# Patient Record
Sex: Female | Born: 1950
Health system: Southern US, Community
[De-identification: ages and names within clinical notes are randomized; demographics above are authoritative.]

## PROBLEM LIST (undated history)

## (undated) DIAGNOSIS — H269 Unspecified cataract: Secondary | ICD-10-CM

## (undated) DIAGNOSIS — Z923 Personal history of irradiation: Secondary | ICD-10-CM

## (undated) DIAGNOSIS — E785 Hyperlipidemia, unspecified: Secondary | ICD-10-CM

## (undated) DIAGNOSIS — E079 Disorder of thyroid, unspecified: Secondary | ICD-10-CM

## (undated) DIAGNOSIS — M810 Age-related osteoporosis without current pathological fracture: Secondary | ICD-10-CM

## (undated) DIAGNOSIS — M1990 Unspecified osteoarthritis, unspecified site: Secondary | ICD-10-CM

## (undated) DIAGNOSIS — M8580 Other specified disorders of bone density and structure, unspecified site: Secondary | ICD-10-CM

## (undated) DIAGNOSIS — T7840XA Allergy, unspecified, initial encounter: Secondary | ICD-10-CM

## (undated) DIAGNOSIS — M4850XA Collapsed vertebra, not elsewhere classified, site unspecified, initial encounter for fracture: Secondary | ICD-10-CM

## (undated) DIAGNOSIS — J449 Chronic obstructive pulmonary disease, unspecified: Secondary | ICD-10-CM

## (undated) HISTORY — DX: Unspecified cataract: H26.9

## (undated) HISTORY — DX: Collapsed vertebra, not elsewhere classified, site unspecified, initial encounter for fracture: M48.50XA

## (undated) HISTORY — DX: Disorder of thyroid, unspecified: E07.9

## (undated) HISTORY — PX: NOSE SURGERY: SHX723

## (undated) HISTORY — DX: Chronic obstructive pulmonary disease, unspecified: J44.9

## (undated) HISTORY — DX: Age-related osteoporosis without current pathological fracture: M81.0

## (undated) HISTORY — DX: Other specified disorders of bone density and structure, unspecified site: M85.80

## (undated) HISTORY — DX: Hyperlipidemia, unspecified: E78.5

## (undated) HISTORY — DX: Allergy, unspecified, initial encounter: T78.40XA

## (undated) HISTORY — PX: OTHER SURGICAL HISTORY: SHX169

---

## 1997-10-16 ENCOUNTER — Other Ambulatory Visit: Admission: RE | Admit: 1997-10-16 | Discharge: 1997-10-16 | Payer: Self-pay | Admitting: Obstetrics and Gynecology

## 1999-02-23 ENCOUNTER — Other Ambulatory Visit: Admission: RE | Admit: 1999-02-23 | Discharge: 1999-02-23 | Payer: Self-pay | Admitting: Obstetrics and Gynecology

## 1999-02-25 ENCOUNTER — Encounter: Payer: Self-pay | Admitting: Obstetrics and Gynecology

## 1999-02-25 ENCOUNTER — Ambulatory Visit (HOSPITAL_COMMUNITY): Admission: RE | Admit: 1999-02-25 | Discharge: 1999-02-25 | Payer: Self-pay | Admitting: Obstetrics and Gynecology

## 2001-04-02 ENCOUNTER — Other Ambulatory Visit: Admission: RE | Admit: 2001-04-02 | Discharge: 2001-04-02 | Payer: Self-pay | Admitting: Obstetrics and Gynecology

## 2001-04-25 ENCOUNTER — Encounter: Payer: Self-pay | Admitting: Obstetrics and Gynecology

## 2001-04-25 ENCOUNTER — Ambulatory Visit (HOSPITAL_COMMUNITY): Admission: RE | Admit: 2001-04-25 | Discharge: 2001-04-25 | Payer: Self-pay | Admitting: Obstetrics and Gynecology

## 2002-06-17 ENCOUNTER — Other Ambulatory Visit: Admission: RE | Admit: 2002-06-17 | Discharge: 2002-06-17 | Payer: Self-pay | Admitting: Obstetrics and Gynecology

## 2002-07-07 ENCOUNTER — Ambulatory Visit (HOSPITAL_COMMUNITY): Admission: RE | Admit: 2002-07-07 | Discharge: 2002-07-07 | Payer: Self-pay | Admitting: Obstetrics and Gynecology

## 2002-07-07 ENCOUNTER — Encounter: Payer: Self-pay | Admitting: Obstetrics and Gynecology

## 2003-09-02 ENCOUNTER — Other Ambulatory Visit: Admission: RE | Admit: 2003-09-02 | Discharge: 2003-09-02 | Payer: Self-pay | Admitting: Obstetrics and Gynecology

## 2003-09-18 ENCOUNTER — Ambulatory Visit (HOSPITAL_COMMUNITY): Admission: RE | Admit: 2003-09-18 | Discharge: 2003-09-18 | Payer: Self-pay | Admitting: Obstetrics and Gynecology

## 2004-05-20 ENCOUNTER — Encounter: Admission: RE | Admit: 2004-05-20 | Discharge: 2004-05-20 | Payer: Self-pay | Admitting: Family Medicine

## 2004-06-02 ENCOUNTER — Ambulatory Visit: Payer: Self-pay | Admitting: Family Medicine

## 2004-08-03 ENCOUNTER — Ambulatory Visit: Payer: Self-pay | Admitting: Family Medicine

## 2004-08-10 ENCOUNTER — Ambulatory Visit: Payer: Self-pay | Admitting: Family Medicine

## 2005-03-29 ENCOUNTER — Ambulatory Visit (HOSPITAL_COMMUNITY): Admission: RE | Admit: 2005-03-29 | Discharge: 2005-03-29 | Payer: Self-pay | Admitting: Family Medicine

## 2005-03-29 ENCOUNTER — Other Ambulatory Visit: Admission: RE | Admit: 2005-03-29 | Discharge: 2005-03-29 | Payer: Self-pay | Admitting: Obstetrics and Gynecology

## 2005-05-27 ENCOUNTER — Ambulatory Visit: Payer: Self-pay | Admitting: Family Medicine

## 2005-08-16 ENCOUNTER — Ambulatory Visit: Payer: Self-pay | Admitting: Family Medicine

## 2005-08-23 ENCOUNTER — Ambulatory Visit: Payer: Self-pay | Admitting: Family Medicine

## 2005-12-19 ENCOUNTER — Ambulatory Visit (HOSPITAL_COMMUNITY): Admission: RE | Admit: 2005-12-19 | Discharge: 2005-12-19 | Payer: Self-pay | Admitting: Family Medicine

## 2006-04-02 ENCOUNTER — Ambulatory Visit (HOSPITAL_COMMUNITY): Admission: RE | Admit: 2006-04-02 | Discharge: 2006-04-02 | Payer: Self-pay | Admitting: Family Medicine

## 2006-11-13 ENCOUNTER — Ambulatory Visit: Payer: Self-pay | Admitting: Family Medicine

## 2006-11-13 LAB — CONVERTED CEMR LAB
ALT: 11 units/L (ref 0–40)
AST: 15 units/L (ref 0–37)
Albumin: 3.4 g/dL — ABNORMAL LOW (ref 3.5–5.2)
Alkaline Phosphatase: 84 units/L (ref 39–117)
BUN: 13 mg/dL (ref 6–23)
Basophils Absolute: 0 10*3/uL (ref 0.0–0.1)
Basophils Relative: 0.3 % (ref 0.0–1.0)
Bilirubin, Direct: 0.1 mg/dL (ref 0.0–0.3)
CO2: 27 meq/L (ref 19–32)
Calcium: 9.3 mg/dL (ref 8.4–10.5)
Chloride: 105 meq/L (ref 96–112)
Cholesterol: 221 mg/dL (ref 0–200)
Creatinine, Ser: 0.9 mg/dL (ref 0.4–1.2)
Direct LDL: 123.8 mg/dL
Eosinophils Absolute: 0.1 10*3/uL (ref 0.0–0.6)
Eosinophils Relative: 0.8 % (ref 0.0–5.0)
GFR calc Af Amer: 83 mL/min
GFR calc non Af Amer: 69 mL/min
Glucose, Bld: 81 mg/dL (ref 70–99)
HCT: 43.4 % (ref 36.0–46.0)
HDL: 70.4 mg/dL (ref >39.0)
Hemoglobin: 14.8 g/dL (ref 12.0–15.0)
Lymphocytes Relative: 17.5 % (ref 12.0–46.0)
MCHC: 34.2 g/dL (ref 30.0–36.0)
MCV: 90.8 fL (ref 78.0–100.0)
Monocytes Absolute: 0.5 10*3/uL (ref 0.2–0.7)
Monocytes Relative: 5.7 % (ref 3.0–11.0)
Neutro Abs: 7.3 10*3/uL (ref 1.4–7.7)
Neutrophils Relative %: 75.7 % (ref 43.0–77.0)
Platelets: 380 10*3/uL (ref 150–400)
Potassium: 4.7 meq/L (ref 3.5–5.1)
RBC: 4.77 M/uL (ref 3.87–5.11)
RDW: 11.1 % — ABNORMAL LOW (ref 11.5–14.6)
Sodium: 140 meq/L (ref 135–145)
TSH: 2.33 microintl units/mL (ref 0.35–5.50)
Total Bilirubin: 0.7 mg/dL (ref 0.3–1.2)
Total CHOL/HDL Ratio: 3.1
Total Protein: 6.6 g/dL (ref 6.0–8.3)
Triglycerides: 148 mg/dL (ref 0–149)
VLDL: 30 mg/dL (ref 0–40)
WBC: 9.6 10*3/uL (ref 4.5–10.5)

## 2006-11-20 ENCOUNTER — Ambulatory Visit: Payer: Self-pay | Admitting: Family Medicine

## 2007-02-05 ENCOUNTER — Ambulatory Visit (HOSPITAL_COMMUNITY): Admission: RE | Admit: 2007-02-05 | Discharge: 2007-02-05 | Payer: Self-pay | Admitting: Family Medicine

## 2007-02-05 ENCOUNTER — Encounter: Payer: Self-pay | Admitting: Family Medicine

## 2007-02-21 ENCOUNTER — Encounter: Payer: Self-pay | Admitting: Family Medicine

## 2007-05-03 ENCOUNTER — Ambulatory Visit: Payer: Self-pay | Admitting: Family Medicine

## 2007-05-20 ENCOUNTER — Other Ambulatory Visit: Admission: RE | Admit: 2007-05-20 | Discharge: 2007-05-20 | Payer: Self-pay | Admitting: Obstetrics and Gynecology

## 2007-05-20 ENCOUNTER — Ambulatory Visit (HOSPITAL_COMMUNITY): Admission: RE | Admit: 2007-05-20 | Discharge: 2007-05-20 | Payer: Self-pay | Admitting: Family Medicine

## 2007-08-28 ENCOUNTER — Ambulatory Visit: Payer: Self-pay | Admitting: Family Medicine

## 2007-08-28 DIAGNOSIS — E785 Hyperlipidemia, unspecified: Secondary | ICD-10-CM | POA: Insufficient documentation

## 2007-08-28 DIAGNOSIS — E039 Hypothyroidism, unspecified: Secondary | ICD-10-CM | POA: Insufficient documentation

## 2007-08-29 ENCOUNTER — Ambulatory Visit: Payer: Self-pay | Admitting: Family Medicine

## 2007-09-18 ENCOUNTER — Telehealth: Payer: Self-pay | Admitting: Family Medicine

## 2007-11-21 ENCOUNTER — Ambulatory Visit: Payer: Self-pay | Admitting: Family Medicine

## 2007-11-21 LAB — CONVERTED CEMR LAB
Bilirubin Urine: NEGATIVE
Glucose, Urine, Semiquant: NEGATIVE
Nitrite: NEGATIVE
Protein, U semiquant: NEGATIVE
Specific Gravity, Urine: 1.025
Urobilinogen, UA: 0.2
WBC Urine, dipstick: NEGATIVE
pH: 5.5

## 2007-11-28 ENCOUNTER — Ambulatory Visit: Payer: Self-pay | Admitting: Family Medicine

## 2007-11-28 DIAGNOSIS — R3129 Other microscopic hematuria: Secondary | ICD-10-CM | POA: Insufficient documentation

## 2007-11-28 DIAGNOSIS — M129 Arthropathy, unspecified: Secondary | ICD-10-CM | POA: Insufficient documentation

## 2007-11-28 LAB — CONVERTED CEMR LAB
ALT: 15 units/L (ref 0–35)
AST: 19 units/L (ref 0–37)
Albumin: 3.6 g/dL (ref 3.5–5.2)
Alkaline Phosphatase: 86 units/L (ref 39–117)
BUN: 15 mg/dL (ref 6–23)
Basophils Absolute: 0 10*3/uL (ref 0.0–0.1)
Basophils Relative: 0.4 % (ref 0.0–1.0)
Bilirubin, Direct: 0.1 mg/dL (ref 0.0–0.3)
CO2: 29 meq/L (ref 19–32)
Calcium: 9.1 mg/dL (ref 8.4–10.5)
Chloride: 104 meq/L (ref 96–112)
Cholesterol: 227 mg/dL (ref 0–200)
Creatinine, Ser: 1 mg/dL (ref 0.4–1.2)
Direct LDL: 145.3 mg/dL
Eosinophils Absolute: 0.1 10*3/uL (ref 0.0–0.7)
Eosinophils Relative: 0.7 % (ref 0.0–5.0)
GFR calc Af Amer: 73 mL/min
GFR calc non Af Amer: 61 mL/min
Glucose, Bld: 76 mg/dL (ref 70–99)
HCT: 44.2 % (ref 36.0–46.0)
HDL: 74.8 mg/dL (ref >39.0)
Hemoglobin: 14.9 g/dL (ref 12.0–15.0)
Lymphocytes Relative: 17.7 % (ref 12.0–46.0)
MCHC: 33.6 g/dL (ref 30.0–36.0)
MCV: 93.1 fL (ref 78.0–100.0)
Monocytes Absolute: 0.5 10*3/uL (ref 0.1–1.0)
Monocytes Relative: 5.5 % (ref 3.0–12.0)
Neutro Abs: 7.1 10*3/uL (ref 1.4–7.7)
Neutrophils Relative %: 75.7 % (ref 43.0–77.0)
Platelets: 399 10*3/uL (ref 150–400)
Potassium: 4.1 meq/L (ref 3.5–5.1)
RBC: 4.75 M/uL (ref 3.87–5.11)
RDW: 11.7 % (ref 11.5–14.6)
Sodium: 138 meq/L (ref 135–145)
TSH: 2.18 microintl units/mL (ref 0.35–5.50)
Total Bilirubin: 0.3 mg/dL (ref 0.3–1.2)
Total CHOL/HDL Ratio: 3
Total Protein: 6.8 g/dL (ref 6.0–8.3)
Triglycerides: 86 mg/dL (ref 0–149)
VLDL: 17 mg/dL (ref 0–40)
WBC: 9.4 10*3/uL (ref 4.5–10.5)

## 2007-12-06 ENCOUNTER — Encounter: Admission: RE | Admit: 2007-12-06 | Discharge: 2007-12-06 | Payer: Self-pay | Admitting: Family Medicine

## 2007-12-11 ENCOUNTER — Ambulatory Visit: Payer: Self-pay | Admitting: Family Medicine

## 2007-12-11 LAB — CONVERTED CEMR LAB
OCCULT 1: NEGATIVE
OCCULT 2: NEGATIVE
OCCULT 3: NEGATIVE

## 2007-12-12 ENCOUNTER — Encounter: Payer: Self-pay | Admitting: Family Medicine

## 2007-12-20 ENCOUNTER — Encounter: Admission: RE | Admit: 2007-12-20 | Discharge: 2007-12-20 | Payer: Self-pay | Admitting: Family Medicine

## 2007-12-26 ENCOUNTER — Telehealth: Payer: Self-pay | Admitting: Family Medicine

## 2008-05-05 ENCOUNTER — Ambulatory Visit: Payer: Self-pay | Admitting: Family Medicine

## 2008-05-21 ENCOUNTER — Ambulatory Visit (HOSPITAL_COMMUNITY): Admission: RE | Admit: 2008-05-21 | Discharge: 2008-05-21 | Payer: Self-pay | Admitting: Addiction Medicine

## 2008-05-21 ENCOUNTER — Ambulatory Visit: Payer: Self-pay | Admitting: Obstetrics and Gynecology

## 2008-05-21 ENCOUNTER — Encounter: Payer: Self-pay | Admitting: Obstetrics and Gynecology

## 2008-05-21 ENCOUNTER — Other Ambulatory Visit: Admission: RE | Admit: 2008-05-21 | Discharge: 2008-05-21 | Payer: Self-pay | Admitting: Obstetrics and Gynecology

## 2008-11-27 ENCOUNTER — Ambulatory Visit: Payer: Self-pay | Admitting: Family Medicine

## 2008-11-27 LAB — CONVERTED CEMR LAB
Glucose, Urine, Semiquant: NEGATIVE
Nitrite: NEGATIVE
Specific Gravity, Urine: 1.02
Urobilinogen, UA: 0.2
pH: 6

## 2008-12-02 ENCOUNTER — Ambulatory Visit: Payer: Self-pay | Admitting: Family Medicine

## 2008-12-02 DIAGNOSIS — M109 Gout, unspecified: Secondary | ICD-10-CM | POA: Insufficient documentation

## 2008-12-02 LAB — CONVERTED CEMR LAB
ALT: 15 units/L (ref 0–35)
AST: 20 units/L (ref 0–37)
Albumin: 3.6 g/dL (ref 3.5–5.2)
Alkaline Phosphatase: 86 units/L (ref 39–117)
BUN: 16 mg/dL (ref 6–23)
Basophils Absolute: 0.1 10*3/uL (ref 0.0–0.1)
Basophils Relative: 0.8 % (ref 0.0–3.0)
Bilirubin, Direct: 0.1 mg/dL (ref 0.0–0.3)
CO2: 27 meq/L (ref 19–32)
Calcium: 9 mg/dL (ref 8.4–10.5)
Chloride: 108 meq/L (ref 96–112)
Cholesterol: 224 mg/dL — ABNORMAL HIGH (ref 0–200)
Creatinine, Ser: 0.9 mg/dL (ref 0.4–1.2)
Direct LDL: 141.9 mg/dL
Eosinophils Absolute: 0.1 10*3/uL (ref 0.0–0.7)
Eosinophils Relative: 1.1 % (ref 0.0–5.0)
GFR calc non Af Amer: 67.91 mL/min (ref >60)
Glucose, Bld: 85 mg/dL (ref 70–99)
HCT: 40.3 % (ref 36.0–46.0)
HDL: 62 mg/dL (ref >39.00)
Hemoglobin: 14 g/dL (ref 12.0–15.0)
Lymphocytes Relative: 21.8 % (ref 12.0–46.0)
Lymphs Abs: 1.9 10*3/uL (ref 0.7–4.0)
MCHC: 34.8 g/dL (ref 30.0–36.0)
MCV: 92.3 fL (ref 78.0–100.0)
Monocytes Absolute: 0.6 10*3/uL (ref 0.1–1.0)
Monocytes Relative: 6.5 % (ref 3.0–12.0)
Neutro Abs: 5.8 10*3/uL (ref 1.4–7.7)
Neutrophils Relative %: 69.8 % (ref 43.0–77.0)
Platelets: 350 10*3/uL (ref 150.0–400.0)
Potassium: 3.8 meq/L (ref 3.5–5.1)
RBC: 4.36 M/uL (ref 3.87–5.11)
RDW: 12 % (ref 11.5–14.6)
Sodium: 140 meq/L (ref 135–145)
TSH: 2.88 microintl units/mL (ref 0.35–5.50)
Total Bilirubin: 0.5 mg/dL (ref 0.3–1.2)
Total CHOL/HDL Ratio: 4
Total Protein: 7 g/dL (ref 6.0–8.3)
Triglycerides: 159 mg/dL — ABNORMAL HIGH (ref 0.0–149.0)
VLDL: 31.8 mg/dL (ref 0.0–40.0)
WBC: 8.5 10*3/uL (ref 4.5–10.5)

## 2009-01-28 ENCOUNTER — Telehealth: Payer: Self-pay | Admitting: Family Medicine

## 2009-04-28 ENCOUNTER — Ambulatory Visit: Payer: Self-pay | Admitting: Family Medicine

## 2009-06-11 ENCOUNTER — Ambulatory Visit (HOSPITAL_COMMUNITY): Admission: RE | Admit: 2009-06-11 | Discharge: 2009-06-11 | Payer: Self-pay | Admitting: Obstetrics and Gynecology

## 2009-06-11 ENCOUNTER — Ambulatory Visit: Payer: Self-pay | Admitting: Obstetrics and Gynecology

## 2009-06-11 ENCOUNTER — Other Ambulatory Visit: Admission: RE | Admit: 2009-06-11 | Discharge: 2009-06-11 | Payer: Self-pay | Admitting: Obstetrics and Gynecology

## 2009-06-11 LAB — HM MAMMOGRAPHY

## 2009-06-23 ENCOUNTER — Encounter: Admission: RE | Admit: 2009-06-23 | Discharge: 2009-06-23 | Payer: Self-pay | Admitting: Obstetrics and Gynecology

## 2009-06-29 ENCOUNTER — Ambulatory Visit: Payer: Self-pay | Admitting: Obstetrics and Gynecology

## 2009-11-26 ENCOUNTER — Telehealth: Payer: Self-pay | Admitting: Family Medicine

## 2009-11-26 ENCOUNTER — Ambulatory Visit: Payer: Self-pay | Admitting: Family Medicine

## 2009-12-07 ENCOUNTER — Ambulatory Visit: Payer: Self-pay | Admitting: Family Medicine

## 2009-12-07 DIAGNOSIS — J209 Acute bronchitis, unspecified: Secondary | ICD-10-CM | POA: Insufficient documentation

## 2009-12-07 LAB — CONVERTED CEMR LAB
Bilirubin Urine: NEGATIVE
Glucose, Urine, Semiquant: NEGATIVE
Nitrite: NEGATIVE
Specific Gravity, Urine: 1.01
Urobilinogen, UA: 0.2
pH: 6

## 2009-12-09 LAB — CONVERTED CEMR LAB
ALT: 13 units/L (ref 0–35)
AST: 18 units/L (ref 0–37)
Albumin: 3.8 g/dL (ref 3.5–5.2)
Alkaline Phosphatase: 92 units/L (ref 39–117)
BUN: 15 mg/dL (ref 6–23)
Basophils Absolute: 0.1 10*3/uL (ref 0.0–0.1)
Basophils Relative: 0.6 % (ref 0.0–3.0)
Bilirubin, Direct: 0.1 mg/dL (ref 0.0–0.3)
CO2: 29 meq/L (ref 19–32)
Calcium: 9.8 mg/dL (ref 8.4–10.5)
Chloride: 99 meq/L (ref 96–112)
Cholesterol: 252 mg/dL — ABNORMAL HIGH (ref 0–200)
Creatinine, Ser: 1 mg/dL (ref 0.4–1.2)
Direct LDL: 164.2 mg/dL
Eosinophils Absolute: 0.1 10*3/uL (ref 0.0–0.7)
Eosinophils Relative: 1.2 % (ref 0.0–5.0)
GFR calc non Af Amer: 58.58 mL/min (ref >60)
Glucose, Bld: 82 mg/dL (ref 70–99)
HCT: 42.5 % (ref 36.0–46.0)
HDL: 80.8 mg/dL (ref >39.00)
Hemoglobin: 14.7 g/dL (ref 12.0–15.0)
Lymphocytes Relative: 25.3 % (ref 12.0–46.0)
Lymphs Abs: 2.3 10*3/uL (ref 0.7–4.0)
MCHC: 34.6 g/dL (ref 30.0–36.0)
MCV: 92.8 fL (ref 78.0–100.0)
Monocytes Absolute: 0.7 10*3/uL (ref 0.1–1.0)
Monocytes Relative: 8 % (ref 3.0–12.0)
Neutro Abs: 5.8 10*3/uL (ref 1.4–7.7)
Neutrophils Relative %: 64.9 % (ref 43.0–77.0)
Platelets: 364 10*3/uL (ref 150.0–400.0)
Potassium: 5.6 meq/L — ABNORMAL HIGH (ref 3.5–5.1)
RBC: 4.58 M/uL (ref 3.87–5.11)
RDW: 12.6 % (ref 11.5–14.6)
Sodium: 138 meq/L (ref 135–145)
TSH: 3.75 microintl units/mL (ref 0.35–5.50)
Total Bilirubin: 0.2 mg/dL — ABNORMAL LOW (ref 0.3–1.2)
Total CHOL/HDL Ratio: 3
Total Protein: 7.2 g/dL (ref 6.0–8.3)
Triglycerides: 122 mg/dL (ref 0.0–149.0)
VLDL: 24.4 mg/dL (ref 0.0–40.0)
WBC: 8.9 10*3/uL (ref 4.5–10.5)

## 2009-12-17 ENCOUNTER — Ambulatory Visit: Payer: Self-pay | Admitting: Family Medicine

## 2010-04-04 ENCOUNTER — Ambulatory Visit (HOSPITAL_COMMUNITY): Admission: RE | Admit: 2010-04-04 | Discharge: 2010-04-04 | Payer: Self-pay | Admitting: Family Medicine

## 2010-04-04 ENCOUNTER — Encounter: Payer: Self-pay | Admitting: Family Medicine

## 2010-04-20 ENCOUNTER — Ambulatory Visit: Payer: Self-pay | Admitting: Family Medicine

## 2010-06-14 ENCOUNTER — Ambulatory Visit (HOSPITAL_COMMUNITY)
Admission: RE | Admit: 2010-06-14 | Discharge: 2010-06-14 | Payer: Self-pay | Source: Home / Self Care | Admitting: Obstetrics and Gynecology

## 2010-06-15 ENCOUNTER — Other Ambulatory Visit: Admission: RE | Admit: 2010-06-15 | Discharge: 2010-06-15 | Payer: Self-pay | Admitting: Obstetrics and Gynecology

## 2010-06-15 ENCOUNTER — Ambulatory Visit: Payer: Self-pay | Admitting: Obstetrics and Gynecology

## 2010-08-01 ENCOUNTER — Ambulatory Visit: Admit: 2010-08-01 | Payer: Self-pay | Admitting: Obstetrics and Gynecology

## 2010-08-03 ENCOUNTER — Telehealth: Payer: Self-pay | Admitting: Family Medicine

## 2010-08-08 ENCOUNTER — Other Ambulatory Visit: Payer: Self-pay | Admitting: Family Medicine

## 2010-08-08 ENCOUNTER — Ambulatory Visit
Admission: RE | Admit: 2010-08-08 | Discharge: 2010-08-08 | Payer: Self-pay | Source: Home / Self Care | Attending: Family Medicine | Admitting: Family Medicine

## 2010-08-08 LAB — HEPATIC FUNCTION PANEL
ALT: 13 U/L (ref 0–35)
AST: 17 U/L (ref 0–37)
Albumin: 3.9 g/dL (ref 3.5–5.2)
Alkaline Phosphatase: 88 U/L (ref 39–117)
Bilirubin, Direct: 0.1 mg/dL (ref 0.0–0.3)
Total Bilirubin: 0.5 mg/dL (ref 0.3–1.2)
Total Protein: 7 g/dL (ref 6.0–8.3)

## 2010-08-08 LAB — LIPID PANEL
Cholesterol: 180 mg/dL (ref 0–200)
HDL: 65.9 mg/dL (ref >39.00)
LDL Cholesterol: 87 mg/dL (ref 0–99)
Total CHOL/HDL Ratio: 3
Triglycerides: 138 mg/dL (ref 0.0–149.0)
VLDL: 27.6 mg/dL (ref 0.0–40.0)

## 2010-08-23 NOTE — Assessment & Plan Note (Signed)
Summary: FLU SHOT // RS  Nurse Visit  CC: Flu shot./kb   Allergies: 1)  ! * Red Dye  Orders Added: 1)  Admin 1st Vaccine [90471] 2)  Flu Vaccine 51yrs + [16109]               Flu Vaccine Consent Questions     Do you have a history of severe allergic reactions to this vaccine? no    Any prior history of allergic reactions to egg and/or gelatin? no    Do you have a sensitivity to the preservative Thimersol? no    Do you have a past history of Guillan-Barre Syndrome? no    Do you currently have an acute febrile illness? no    Have you ever had a severe reaction to latex? no    Vaccine information given and explained to patient? yes    Are you currently pregnant? no    Lot Number:[id number]   Exp Date:01/21/2011   Site Given  Left Deltoid IMu

## 2010-08-23 NOTE — Assessment & Plan Note (Signed)
Summary: cpx/no pap/njr   Vital Signs:  Patient Profile:   60 Years Old Female Weight:      146 pounds Temp:     98.3 degrees F Pulse rate:   76 / minute BP sitting:   140 / 80  (left arm) Cuff size:   regular  Vitals Entered By: Pura Spice, RN (Nov 28, 2007 2:53 PM)                 Chief Complaint:  for cpx and pap by DR. Gottesegen.  History of Present Illness: Pt in for physical examination,yearly evaluation had pap smear and pelvic ultrasoun revals fibroids, he reduced premarin to .625 complains of pain left mp joint thumb , responding to advil but needs to increase dosage submandibular node has been normal since last txcolonoscopic exam was neg 9 yrs ago, due 2010 Had 2plus hematuria at Dr. Nicolasa Ducking office as well as here last year and also this year       Current Allergies (reviewed today): ! * RED DYE     Review of Systems      See HPI  General      Denies chills, fatigue, fever, loss of appetite, malaise, sleep disorder, sweats, weakness, and weight loss.  Eyes      Denies blurring, discharge, double vision, eye irritation, eye pain, halos, itching, light sensitivity, red eye, vision loss-1 eye, and vision loss-both eyes.  ENT      Complains of nasal congestion.      rsponds to Nasacort AQ  CV      Denies bluish discoloration of lips or nails, chest pain or discomfort, difficulty breathing at night, difficulty breathing while lying down, fainting, fatigue, leg cramps with exertion, lightheadness, near fainting, palpitations, shortness of breath with exertion, swelling of feet, swelling of hands, and weight gain.  Resp      Denies chest discomfort, chest pain with inspiration, cough, coughing up blood, excessive snoring, hypersomnolence, morning headaches, pleuritic, shortness of breath, sputum productive, and wheezing.  GI      Denies abdominal pain, bloody stools, change in bowel habits, constipation, dark tarry stools, diarrhea, excessive  appetite, gas, hemorrhoids, indigestion, loss of appetite, nausea, vomiting, vomiting blood, and yellowish skin color.  GU      See HPI      Complains of hematuria.      Denies abnormal vaginal bleeding, decreased libido, discharge, dysuria, incontinence, nocturia, urinary frequency, and urinary hesitancy.  MS      See HPI      Complains of stiffness.      back stiffness  Derm      Denies changes in color of skin, changes in nail beds, dryness, excessive perspiration, flushing, hair loss, insect bite(s), itching, lesion(s), poor wound healing, and rash.  Neuro      Denies brief paralysis, difficulty with concentration, disturbances in coordination, falling down, headaches, inability to speak, memory loss, numbness, poor balance, seizures, sensation of room spinning, tingling, tremors, visual disturbances, and weakness.  Psych      Denies alternate hallucination ( auditory/visual), anxiety, depression, easily angered, easily tearful, irritability, mental problems, panic attacks, sense of great danger, suicidal thoughts/plans, thoughts of violence, unusual visions or sounds, and thoughts /plans of harming others.  Endo      Denies cold intolerance, excessive hunger, excessive thirst, excessive urination, heat intolerance, polyuria, and weight change.  Heme      Denies abnormal bruising, bleeding, enlarge lymph nodes, fevers, pallor, and skin discoloration.  Allergy      Denies hives or rash, itching eyes, persistent infections, seasonal allergies, and sneezing.  ENT      Complains of nasal congestion.   Physical Exam  General:     Well-developed,well-nourished,in no acute distress; alert,appropriate and cooperative throughout examination Head:     Normocephalic and atraumatic without obvious abnormalities. No apparent alopecia or balding. Eyes:     No corneal or conjunctival inflammation noted. EOMI. Perrla. Funduscopic exam benign, without hemorrhages, exudates or papilledema.  Vision grossly normal. Ears:     External ear exam shows no significant lesions or deformities.  Otoscopic examination reveals clear canals, tympanic membranes are intact bilaterally without bulging, retraction, inflammation or discharge. Hearing is grossly normal bilaterally. Nose:     minimal nasal congestion Mouth:     Oral mucosa and oropharynx without lesions or exudates.  Teeth in good repair. Neck:     No deformities, masses, or tenderness noted. Chest Wall:     No deformities, masses, or tenderness noted. Breasts:     No mass, nodules, thickening, tenderness, bulging, retraction, inflamation, nipple discharge or skin changes noted.   Lungs:     Normal respiratory effort, chest expands symmetrically. Lungs are clear to auscultation, no crackles or wheezes. Heart:     Normal rate and regular rhythm. S1 and S2 normal without gallop, murmur, click, rub or other extra sounds. Abdomen:     Bowel sounds positive,abdomen soft and non-tender without masses, organomegaly or hernias noted. Rectal:     gyn Genitalia:     gyn Msk:     tender left mp joint left thumb Pulses:     R and L carotid,radial,femoral,dorsalis pedis and posterior tibial pulses are full and equal bilaterally Extremities:     No clubbing, cyanosis, edema, or deformity noted with normal full range of motion of all joints.   Neurologic:     No cranial nerve deficits noted. Station and gait are normal. Plantar reflexes are down-going bilaterally. DTRs are symmetrical throughout. Sensory, motor and coordinative functions appear intact. Skin:     Intact without suspicious lesions or rashes Cervical Nodes:     No lymphadenopathy noted Axillary Nodes:     No palpable lymphadenopathy Inguinal Nodes:     No significant adenopathy Psych:     Cognition and judgment appear intact. Alert and cooperative with normal attention span and concentration. No apparent delusions, illusions, hallucinations    Impression &  Recommendations:  Problem # 1:  PHYSICAL EXAMINATION (ICD-V70.0) Assessment: New  Problem # 2:  HYPOTHYROIDISM (ICD-244.9) Assessment: Unchanged  The following medications were removed from the medication list:    Synthroid 50 Mcg Tabs (Levothyroxine sodium)  Her updated medication list for this problem includes:    Synthroid 100 Mcg Tabs (Levothyroxine sodium) .Marland Kitchen... 1 by mouth once daily   Problem # 3:  ARTHRITIS (ICD-716.90) Assessment: New  Problem # 4:  MENOPAUSAL SYNDROME (ICD-627.2) Assessment: Unchanged  Her updated medication list for this problem includes:    Premarin 0.625 Mg Tabs (Estrogens conjugated) ..... Once daily   Problem # 5:  HYPERLIPIDEMIA (ICD-272.4) Assessment: Unchanged  Her updated medication list for this problem includes:    Zetia 10 Mg Tabs (Ezetimibe) .Marland Kitchen... 1 by mouth once daily   Complete Medication List: 1)  Nasacort Aq 55 Mcg/act Aers (Triamcinolone acetonide(nasal)) .... 2 sprays  each nostril once daily 2)  Synthroid 100 Mcg Tabs (Levothyroxine sodium) .Marland Kitchen.. 1 by mouth once daily 3)  Premarin 0.625 Mg Tabs (Estrogens conjugated) .Marland KitchenMarland KitchenMarland Kitchen  Once daily 4)  Provera 2.5 Mg Tabs (Medroxyprogesterone acetate) .... Once daily 5)  Zetia 10 Mg Tabs (Ezetimibe) .Marland Kitchen.. 1 by mouth once daily  Other Orders: Misc. Referral (Misc. Ref)  Osteoporosis Risk Assessment:  Risk Factors for Fracture or Low Bone Density:   Race (White or Asian):     yes   Smoking status:       current   Thyroid replacement:     yes   Thyroid disease:     yes  Immunization & Chemoprophylaxis:    Influenza vaccine: Fluvax Non-MCR  (05/03/2007)   Patient Instructions: 1)  To continue low fat diet 2)  increase exercise 3)  use advil for arthritis 2-3 three times a day as needed 4)  continue other medications as prescribed 5)  to have ultrasound kidneys  to evaluate hematuria then refer to urologist  to evaluate hematuria   Prescriptions: ZETIA 10 MG  TABS (EZETIMIBE) 1 by  mouth once daily Brand medically necessary #90 x 3   Entered and Authorized by:   Judithann Sheen MD   Signed by:   Judithann Sheen MD on 11/28/2007   Method used:   Print then Give to Patient   RxID:   [id number] PROVERA 2.5 MG  TABS (MEDROXYPROGESTERONE ACETATE) once daily  #90 x 3   Entered and Authorized by:   Judithann Sheen MD   Signed by:   Judithann Sheen MD on 11/28/2007   Method used:   Print then Give to Patient   RxID:   [id number] SYNTHROID 100 MCG  TABS (LEVOTHYROXINE SODIUM) 1 by mouth once daily Brand medically necessary #90 x 3   Entered and Authorized by:   Judithann Sheen MD   Signed by:   Judithann Sheen MD on 11/28/2007   Method used:   Print then Give to Patient   RxID:   [id number] NASACORT AQ 55 MCG/ACT  AERS (TRIAMCINOLONE ACETONIDE(NASAL)) 2 sprays  each nostril once daily  #3 x 3   Entered and Authorized by:   Judithann Sheen MD   Signed by:   Judithann Sheen MD on 11/28/2007   Method used:   Print then Give to Patient   RxID:   [id number] PREMARIN 0.625 MG  TABS (ESTROGENS CONJUGATED) once daily Brand medically necessary #90 x 3   Entered and Authorized by:   Judithann Sheen MD   Signed by:   Judithann Sheen MD on 11/28/2007   Method used:   Print then Give to Patient   RxID:   [id number]  ]

## 2010-08-23 NOTE — Letter (Signed)
Summary: Results Follow-up Letter  [place] [facility]  [address] [place], [place] [address]   Phone: [phone]  Fax: [phone]    12/12/2007  [address] [place], [place]  [address]  Dear Stacy Gillespie,   The following are the results of your recent test(s): ________ Other Tests:  HEMOCULT CARDS WERE NEGATIVE _________________________________________________________  Sincerely,       [place] [facility]

## 2010-08-23 NOTE — Assessment & Plan Note (Signed)
Summary: CPX // RS   Vital Signs:  Patient profile:   60 year old female Height:      66.75 inches Weight:      142 pounds BMI:     22.49 Temp:     98.6 degrees F Pulse rate:   89 / minute Pulse rhythm:   regular BP sitting:   130 / 80  (left arm)  Vitals Entered By: Pura Spice, RN (Dec 07, 2009 11:16 AM) CC: cpx no pap sees dr Dianne Dun  states had mammogram    History of Present Illness: This 60 year old white married female is in for complete physical examination she has had her Pap smear done by Dr. Faustino Congress and has had her M. Cheree Ditto It is time for her colonoscopic exam will schedule that He is to refill her medications on a day Smoked one package cigarettes per day, chest x-ray reveals signs of COPD and we will schedule a pulmonary function test in near future In general the patient relates she feels good has had not had a recent episode gallop but does have her arthritis control with diclofenac  EKG  Procedure date:  12/07/2009  Findings:       sinus rhythm with rate of:  75 Normal  ECG  Allergies: 1)  ! * Red Dye  Past History:  Past Medical History: Last updated: 11/27/2007 Hyperlipidemia Hyperthyroidism  Risk Factors: Smoking Status: current (11/27/2007) Packs/Day: 1 (11/27/2007)  Past Surgical History: FX nose with surgrty 15 yrs ago  Past History:  Care Management: Gynecology:Gottsagen   Review of Systems  The patient denies anorexia, fever, weight loss, weight gain, vision loss, decreased hearing, hoarseness, chest pain, syncope, dyspnea on exertion, peripheral edema, prolonged cough, headaches, hemoptysis, abdominal pain, melena, hematochezia, severe indigestion/heartburn, hematuria, incontinence, genital sores, muscle weakness, suspicious skin lesions, transient blindness, difficulty walking, depression, unusual weight change, abnormal bleeding, enlarged lymph nodes, angioedema, breast masses, and testicular masses.    Physical  Exam  General:  Well-developed,well-nourished,in no acute distress; alert,appropriate and cooperative throughout examination Head:  Normocephalic and atraumatic without obvious abnormalities. No apparent alopecia or balding. Eyes:  No corneal or conjunctival inflammation noted. EOMI. Perrla. Funduscopic exam benign, without hemorrhages, exudates or papilledema. Vision grossly normal. Ears:  External ear exam shows no significant lesions or deformities.  Otoscopic examination reveals clear canals, tympanic membranes are intact bilaterally without bulging, retraction, inflammation or discharge. Hearing is grossly normal bilaterally. Nose:  minimal nasal congestion no boggy mucosa clear drainage Mouth:  Oral mucosa and oropharynx without lesions or exudates.  Teeth in good repair. Neck:  No deformities, masses, or tenderness noted. Chest Wall:  No deformities, masses, or tenderness noted. Breasts:  No mass, nodules, thickening, tenderness, bulging, retraction, inflamation, nipple discharge or skin changes noted.   Lungs:  Normal respiratory effort, chest expands symmetrically. Lungs are clear to auscultation, no crackles or wheezes. Heart:  Normal rate and regular rhythm. S1 and S2 normal without gallop, murmur, click, rub or other extra sounds. Abdomen:  Bowel sounds positive,abdomen soft and non-tender without masses, organomegaly or hernias noted. Rectal:  not examined Genitalia:  not examined Msk:  No deformity or scoliosis noted of thoracic or lumbar spine.   HEENT not home second toe right foot Pulses:  R and L carotid,radial,femoral,dorsalis pedis and posterior tibial pulses are full and equal bilaterally Extremities:  No clubbing, cyanosis, edema, or deformity noted with normal full range of motion of all joints.   Neurologic:  No cranial nerve deficits noted.  Station and gait are normal. Plantar reflexes are down-going bilaterally. DTRs are symmetrical throughout. Sensory, motor and  coordinative functions appear intact. Skin:  Intact without suspicious lesions or rashes Cervical Nodes:  No lymphadenopathy noted Axillary Nodes:  No palpable lymphadenopathy Inguinal Nodes:  No significant adenopathy Psych:  Cognition and judgment appear intact. Alert and cooperative with normal attention span and concentration. No apparent delusions, illusions, hallucinations   Impression & Recommendations:  Problem # 1:  PHYSICAL EXAMINATION (ICD-V70.0) Assessment Unchanged  Orders: EKG w/ Interpretation (93000)  Problem # 2:  GOUT (ICD-274.9) Assessment: Improved  Her updated medication list for this problem includes:    Indomethacin 50 Mg Caps (Indomethacin) .Marland Kitchen... 1 tidpc for gout    Diclofenac Sodium 75 Mg Tbec (Diclofenac sodium) .Marland Kitchen... 1 two times a day for arthritis  Problem # 3:  ARTHRITIS (ICD-716.90) Assessment: Improved diclofenac 75 mg b.i.d.  Problem # 4:  MENOPAUSAL SYNDROME (ICD-627.2) Assessment: Improved  The following medications were removed from the medication list:    Prempro 0.625-2.5 Mg Tabs (Conj estrog-medroxyprogest ace) .Marland Kitchen... 1 qd Her updated medication list for this problem includes:    Prempro 0.45-1.5 Mg Tabs (Conj estrog-medroxyprogest ace) .Marland Kitchen... 1 by mouth once daily  Problem # 5:  HYPERLIPIDEMIA (ICD-272.4) Assessment: Deteriorated  The following medications were removed from the medication list:    Zetia 10 Mg Tabs (Ezetimibe) .Marland Kitchen... 1 by mouth once daily Her updated medication list for this problem includes:    Crestor 10 Mg Tabs (Rosuvastatin calcium) .Marland Kitchen... 1 qd  Problem # 6:  PAROTITIS, LEFT (ICD-527.2) Assessment: Improved  Complete Medication List: 1)  Synthroid 100 Mcg Tabs (Levothyroxine sodium) .Marland Kitchen.. 1 by mouth once daily 2)  Indomethacin 50 Mg Caps (Indomethacin) .Marland Kitchen.. 1 tidpc for gout 3)  Diclofenac Sodium 75 Mg Tbec (Diclofenac sodium) .Marland Kitchen.. 1 two times a day for arthritis 4)  Omnaris 50 Mcg/act Susp (Ciclesonide) .... 2  sprays each nostril each day 5)  Prempro 0.45-1.5 Mg Tabs (Conj estrog-medroxyprogest ace) .Marland Kitchen.. 1 by mouth once daily 6)  Crestor 10 Mg Tabs (Rosuvastatin calcium) .Marland Kitchen.. 1 qd  Other Orders: T-2 View CXR (71020TC)  Patient Instructions: 1)  findings on physical examination a very good 2)  Review of laboratory studies show that we need to start Crestor and continue Zetia for your hyperlipidemia 3)  Continue her other medications as prescribed Prescriptions: CRESTOR 10 MG TABS (ROSUVASTATIN CALCIUM) 1 qd  #30 x 11   Entered and Authorized by:   Judithann Sheen MD   Signed by:   Judithann Sheen MD on 12/07/2009   Method used:   Print then Give to Patient   RxID:   [id number] OMNARIS 50 MCG/ACT SUSP (CICLESONIDE) 2 sprays each nostril each day  #1 x 11   Entered and Authorized by:   Judithann Sheen MD   Signed by:   Judithann Sheen MD on 12/07/2009   Method used:   Print then Give to Patient   RxID:   [id number] DICLOFENAC SODIUM 75 MG TBEC (DICLOFENAC SODIUM) 1 two times a day for arthritis  #60 x 11   Entered and Authorized by:   Judithann Sheen MD   Signed by:   Judithann Sheen MD on 12/07/2009   Method used:   Print then Give to Patient   RxID:   [id number] INDOMETHACIN 50 MG CAPS (INDOMETHACIN) 1 tidpc for gout  #90 x 11   Entered and Authorized by:  Judithann Sheen MD   Signed by:   Judithann Sheen MD on 12/07/2009   Method used:   Print then Give to Patient   RxID:   [id number] PREMPRO 0.45-1.5 MG TABS (CONJ ESTROG-MEDROXYPROGEST ACE) 1 by mouth once daily  #90 x 3   Entered and Authorized by:   Judithann Sheen MD   Signed by:   Judithann Sheen MD on 12/07/2009   Method used:   Print then Give to Patient   RxID:   [id number] SYNTHROID 100 MCG  TABS (LEVOTHYROXINE SODIUM) 1 by mouth once daily Brand medically necessary #90 x 3   Entered and Authorized by:   Judithann Sheen MD   Signed by:    Judithann Sheen MD on 12/07/2009   Method used:   Print then Give to Patient   RxID:   [id number]

## 2010-08-23 NOTE — Progress Notes (Signed)
Summary: pt requesting new script for Omnaris  Phone Note Call from Patient Call back at Home Phone [phone]   Caller: Patient Call For: Judithann Sheen MD Summary of Call: Pt is finished with the sample of Omnaris and would like Dr. Scotty Court to call in a script for [facility] to [facility] on [address] [phone] Initial call taken by: Lucy Antigua,  January 28, 2009 8:22 AM  Follow-up for Phone Call        ok per dr Alfonzo Feller  called in with 6 refills  Follow-up by: Pura Spice, RN,  January 28, 2009 9:41 AM      Prescriptions: OMNARIS 50 MCG/ACT SUSP (CICLESONIDE) 2 sprays each nostril each day  #1 x 6   Entered by:   Pura Spice, RN   Authorized by:   Judithann Sheen MD   Signed by:   Pura Spice, RN on 01/28/2009   Method used:   [facility] to        [facility] [phone][facility] (retail)       [address]       [place], [place]  [address]       Ph: [phone] or [phone]       Fax: [phone]   RxID:   [id number]

## 2010-08-23 NOTE — Assessment & Plan Note (Signed)
Summary: High temp, started last night/nn   Vital Signs:  Patient Profile:   60 Years Old Female Weight:      142 pounds Temp:     98.2 degrees F Pulse rate:   100 / minute BP sitting:   104 / 78  (left arm) Cuff size:   regular  Vitals Entered By: g hudy rn                 Chief Complaint:  left side swollen and reddness.  History of Present Illness: doing fine until 3 days ago aand had onset pain left face,then swelling and pain over parotid area onset fever and pain increased in severity, unable to open mouth very wide and causing considerale pain no other problems  Current Allergies: ! * RED DYE     Review of Systems      See HPI  General      Complains of fever.  ENT      Complains of nasal congestion.  CV      Denies bluish discoloration of lips or nails, chest pain or discomfort, difficulty breathing at night, difficulty breathing while lying down, fainting, fatigue, leg cramps with exertion, lightheadness, near fainting, palpitations, shortness of breath with exertion, swelling of feet, swelling of hands, and weight gain.  Resp      Denies chest discomfort, chest pain with inspiration, cough, coughing up blood, excessive snoring, hypersomnolence, morning headaches, pleuritic, shortness of breath, sputum productive, and wheezing.  GI      Denies abdominal pain, bloody stools, change in bowel habits, constipation, dark tarry stools, diarrhea, excessive appetite, gas, hemorrhoids, indigestion, loss of appetite, nausea, vomiting, vomiting blood, and yellowish skin color.   Physical Exam  General:     Well-developed,well-nourished,in no acute distress; alert,appropriate and cooperative throughout examination acutely ill with pain Head:     Normocephalic and atraumatic without obvious abnormalities. No apparent alopecia or balding. Eyes:     No corneal or conjunctival inflammation noted. EOMI. Perrla. Funduscopic exam benign, without hemorrhages, exudates  or papilledema. Vision grossly normal. Ears:     External ear exam shows no significant lesions or deformities.  Otoscopic examination reveals clear canals, tympanic membranes are intact bilaterally without bulging, retraction, inflammation or discharge. Hearing is grossly normal bilaterally. Nose:     erythematous mucosa, yellow drainage Mouth:     stinsons duct enlarged and red Neck:     Left  parotid enlarged,firm and very tender Lungs:     Normal respiratory effort, chest expands symmetrically. Lungs are clear to auscultation, no crackles or wheezes. Heart:     Normal rate and regular rhythm. S1 and S2 normal without gallop, murmur, click, rub or other extra sounds. Abdomen:     Bowel sounds positive,abdomen soft and non-tender without masses, organomegaly or hernias noted.    Complete Medication List: 1)  Cephalexin 500 Mg Caps (Cephalexin) .... 2 qid for two days then 1 two times a day   Patient Instructions: 1)  parotid  gland infected 2)  cold packs  as needed for pain 3)  advill 1-2 as needed for pain 4)  Rocephin 1.0 gm Im today and Thursday 5)  cephalexin 500mg   as directed 6)  Saline gargles warm four times per day    Prescriptions: CEPHALEXIN 500 MG  CAPS (CEPHALEXIN) 2 qid for two days then 1 two times a day  #30 x 1   Entered and Authorized by:   Judithann Sheen MD  Signed by:   Judithann Sheen MD on 08/28/2007   Method used:   [facility] to ...       [facility] [id number]       [address]       [place], [place]  [address]       Ph: [phone] or [phone]       Fax: [phone]   RxID:   [id number]  ]  Medication Administration  Injection # 1:    Medication: Rocephin  250mg     Diagnosis: PAROTITIS, LEFT (ICD-527.2)    Route: IM    Site: LUOQ gluteus    Exp Date: 10/2008    Lot #: [id number]    Mfr: Sander Radon plus    Comments: 1 gram given    Patient tolerated injection without complications    Given by: Pura Spice,  RN (August 28, 2007 10:58 AM)  Orders Added: 1)  Rocephin  250mg  [J0696] 2)  Admin of Therapeutic Inj  intramuscular or subcutaneous [96372] 3)  Est. Patient Level III [34742]

## 2010-08-23 NOTE — Letter (Signed)
Summary: Results Follow up Letter  [facility]  [address] [place], [place] [address]   Phone: [phone]  Fax: [phone]    02/21/2007 MRN: [id number]  [facility] [address] [place], [place]  [address]  Dear Ms. Stacy Gillespie,  The following are the results of your recent test(s):  Test         Result    Pap Smear:        Normal _____  Not Normal _____ Comments: ______________________________________________________ Cholesterol: LDL(Bad cholesterol):         Your goal is less than:         HDL (Good cholesterol):       Your goal is more than: Comments:  ______________________________________________________ Mammogram:        Normal _____  Not Normal _____ Comments:  ___________________________________________________________________ Hemoccult:        Normal _____  Not normal _______ Comments:    _____________________________________________________________________ Other Tests PER BONE DENSITY RESULTS TAKE CALCIUM WITH VITAMIN D THREEE TIMES A DAY       We routinely do not discuss normal results over the telephone.  If you desire a copy of the results, or you have any questions about this information we can discuss them at your next office visit.   Sincerely,    GINA

## 2010-08-23 NOTE — Progress Notes (Signed)
Summary: med refill  omaris   Phone Note Call from Patient   Caller: Patient Call For: Judithann Sheen MD Summary of Call: pt. needs a refill for [facility] [address]  Thanks! Initial call taken by: Georgian Co,  Nov 26, 2009 9:49 AM  Follow-up for Phone Call        ok x 1 pt to be seen may 17 11  Follow-up by: Pura Spice, RN,  Nov 30, 2009 9:39 AM    Prescriptions: OMNARIS 50 MCG/ACT SUSP (CICLESONIDE) 2 sprays each nostril each day  #1 x 0   Entered by:   Pura Spice, RN   Authorized by:   Judithann Sheen MD   Signed by:   Pura Spice, RN on 11/30/2009   Method used:   [facility]        [facility] [phone]* (retail)       [address]       [place], [place]  [address]       Ph: [phone] or [phone]       Fax: [phone]   RxID:   [id number]

## 2010-08-23 NOTE — Assessment & Plan Note (Signed)
Summary: flu shot/cjr  Nurse Visit Flu Vaccine Consent Questions     Do you have a history of severe allergic reactions to this vaccine? no    Any prior history of allergic reactions to egg and/or gelatin? no    Do you have a sensitivity to the preservative Thimersol? no    Do you have a past history of Guillan-Barre Syndrome? no    Do you currently have an acute febrile illness? no    Have you ever had a severe reaction to latex? no    Vaccine information given and explained to patient? yes    Are you currently pregnant? no    Lot Number:[id number]   Exp Date:01/20/2010   Site Given  Left Deltoid IM    Allergies: 1)  ! * Red Dye  Orders Added: 1)  Admin 1st Vaccine [[address]71] 2)  Flu Vaccine 12yrs + [[address]]

## 2010-08-23 NOTE — Assessment & Plan Note (Signed)
Summary: cpx/no pap/njr   Vital Signs:  Patient profile:   60 year old female Height:      67 inches Weight:      143.5 pounds BMI:     22.56 Temp:     98.1 degrees F oral Pulse rate:   87 / minute Pulse rhythm:   regular BP sitting:   120 / 80  (left arm) Cuff size:   regular  Vitals Entered By: Benny Lennert CMA (Dec 02, 2008 2:21 PM)  History of Present Illness: Chief complaint CPX WITH OUT PAP Pt in for cpx complaints pain and swelling left big toe for approx 6 months also complains of pain MP joints both thumbs for sometime, relieved somewhat with diclofenac no other complaints  Allergies: 1)  ! * Red Dye  Review of Systems      See HPI General:  See HPI; Denies chills, fatigue, fever, loss of appetite, malaise, sleep disorder, sweats, weakness, and weight loss. Eyes:  Denies blurring, discharge, double vision, eye irritation, eye pain, halos, itching, light sensitivity, red eye, vision loss-1 eye, and vision loss-both eyes. ENT:  Complains of nasal congestion; relieved with Omnaris. CV:  Denies bluish discoloration of lips or nails, chest pain or discomfort, difficulty breathing at night, difficulty breathing while lying down, fainting, fatigue, leg cramps with exertion, lightheadness, near fainting, palpitations, shortness of breath with exertion, swelling of feet, swelling of hands, and weight gain. Resp:  Denies chest discomfort, chest pain with inspiration, cough, coughing up blood, excessive snoring, hypersomnolence, morning headaches, pleuritic, shortness of breath, sputum productive, and wheezing. GI:  Denies abdominal pain, bloody stools, change in bowel habits, constipation, dark tarry stools, diarrhea, excessive appetite, gas, hemorrhoids, indigestion, loss of appetite, nausea, vomiting, vomiting blood, and yellowish skin color. GU:  Denies abnormal vaginal bleeding, decreased libido, discharge, dysuria, genital sores, hematuria, incontinence, nocturia, urinary  frequency, and urinary hesitancy. MS:  See HPI; Complains of joint pain. Derm:  See HPI; Denies changes in color of skin, changes in nail beds, dryness, excessive perspiration, flushing, hair loss, insect bite(s), itching, lesion(s), poor wound healing, and rash. Neuro:  Denies brief paralysis, difficulty with concentration, disturbances in coordination, falling down, headaches, inability to speak, memory loss, numbness, poor balance, seizures, sensation of room spinning, tingling, tremors, visual disturbances, and weakness. Psych:  Denies alternate hallucination ( auditory/visual), anxiety, depression, easily angered, easily tearful, irritability, mental problems, panic attacks, sense of great danger, suicidal thoughts/plans, thoughts of violence, unusual visions or sounds, and thoughts /plans of harming others. Endo:  Denies cold intolerance, excessive hunger, excessive thirst, excessive urination, heat intolerance, polyuria, and weight change.  Physical Exam  General:  Well-developed,well-nourished,in no acute distress; alert,appropriate and cooperative throughout examination Head:  Normocephalic and atraumatic without obvious abnormalities. No apparent alopecia or balding. Eyes:  No corneal or conjunctival inflammation noted. EOMI. Perrla. Funduscopic exam benign, without hemorrhages, exudates or papilledema. Vision grossly normal. Ears:  External ear exam shows no significant lesions or deformities.  Otoscopic examination reveals clear canals, tympanic membranes are intact bilaterally without bulging, retraction, inflammation or discharge. Hearing is grossly normal bilaterally. Nose:  min nasal congestion Mouth:  Oral mucosa and oropharynx without lesions or exudates.  Teeth in good repair. Neck:  No deformities, masses, or tenderness noted. Chest Wall:  No deformities, masses, or tenderness noted. Breasts:  No mass, nodules, thickening, tenderness, bulging, retraction, inflamation, nipple  discharge or skin changes noted.   Lungs:  Normal respiratory effort, chest expands symmetrically. Lungs are clear to auscultation,  no crackles or wheezes. Heart:  Normal rate and regular rhythm. S1 and S2 normal without gallop, murmur, click, rub or other extra sounds. Abdomen:  Bowel sounds positive,abdomen soft and non-tender without masses, organomegaly or hernias noted. Rectal:  NE Genitalia:  NE Msk:  tender MP joints both thumbs swollen slightly and tender on movement left bigtoe swollen considerably and very tener and painful Pulses:  R and L carotid,radial,femoral,dorsalis pedis and posterior tibial pulses are full and equal bilaterally Extremities:  No clubbing, cyanosis, edema, or deformity noted with normal full range of motion of all joints.   Neurologic:  No cranial nerve deficits noted. Station and gait are normal. Plantar reflexes are down-going bilaterally. DTRs are symmetrical throughout. Sensory, motor and coordinative functions appear intact.   Impression & Recommendations:  Problem # 1:  PHYSICAL EXAMINATION (ICD-V70.0) Assessment Unchanged  Problem # 2:  GOUT (ICD-274.9) Assessment: New  The following medications were removed from the medication list:    Nabumetone 750 Mg Tabs (Nabumetone) .Marland Kitchen... 1 two times a day after meals for arthritis Her updated medication list for this problem includes:    Indomethacin 50 Mg Caps (Indomethacin) .Marland Kitchen... 1 tidpc for gout    Diclofenac Sodium 75 Mg Tbec (Diclofenac sodium) .Marland Kitchen... 1 two times a day for arthritis  Orders: Prescription Created Electronically [phone])  Problem # 3:  MICROSCOPIC HEMATURIA (ICD-599.72) Assessment: Unchanged  Problem # 4:  ARTHRITIS (ICD-716.90) Assessment: Deteriorated  Problem # 5:  MENOPAUSAL SYNDROME (ICD-627.2) Assessment: Unchanged  The following medications were removed from the medication list:    Premarin 0.625 Mg Tabs (Estrogens conjugated) ..... Once daily Her updated medication list  for this problem includes:    Prempro 0.625-2.5 Mg Tabs (Conj estrog-medroxyprogest ace) .Marland Kitchen... 1 qd  Problem # 6:  HYPOTHYROIDISM (ICD-244.9) Assessment: Unchanged  Her updated medication list for this problem includes:    Synthroid 100 Mcg Tabs (Levothyroxine sodium) .Marland Kitchen... 1 by mouth once daily  Complete Medication List: 1)  Synthroid 100 Mcg Tabs (Levothyroxine sodium) .Marland Kitchen.. 1 by mouth once daily 2)  Zetia 10 Mg Tabs (Ezetimibe) .Marland Kitchen.. 1 by mouth once daily 3)  Prempro 0.625-2.5 Mg Tabs (Conj estrog-medroxyprogest ace) .Marland Kitchen.. 1 qd 4)  Indomethacin 50 Mg Caps (Indomethacin) .Marland Kitchen.. 1 tidpc for gout 5)  Diclofenac Sodium 75 Mg Tbec (Diclofenac sodium) .Marland Kitchen.. 1 two times a day for arthritis 6)  Omnaris 50 Mcg/act Susp (Ciclesonide) .... 2 sprays each nostril each day  Patient Instructions: 1)  For gout, take indomethacin 50 mg three times a day 2)   for at least one week after pain gone 3)  use diclofenac for arthritis of thumbs 4)  Omnaris for nasal congestion 5)  Low fat diet Prescriptions: INDOMETHACIN 50 MG CAPS (INDOMETHACIN) 1 tidpc for gout  #90 x 11   Entered and Authorized by:   Judithann Sheen MD   Signed by:   Judithann Sheen MD on 12/02/2008   Method used:   [facility]        [facility] [phone][facility] (retail)       [address]       [place], [place]  [address]       Ph: [phone] or [phone]       Fax: [phone]   RxID:   [id number] ZETIA 10 MG  TABS (EZETIMIBE) 1 by mouth once daily Brand medically necessary #90 x 3   Entered and Authorized by:   Judithann Sheen MD  Signed by:   Judithann Sheen MD on 12/02/2008   Method used:   Print then Give to Patient   RxID:   [id number] PREMPRO 0.625-2.5 MG TABS (CONJ ESTROG-MEDROXYPROGEST ACE) 1 qd  #84 x 3   Entered and Authorized by:   Judithann Sheen MD   Signed by:   Judithann Sheen MD on 12/02/2008   Method used:   Print then Give to Patient   RxID:   [id number]  SYNTHROID 100 MCG  TABS (LEVOTHYROXINE SODIUM) 1 by mouth once daily Brand medically necessary #90 x 3   Entered and Authorized by:   Judithann Sheen MD   Signed by:   Judithann Sheen MD on 12/02/2008   Method used:   Print then Give to Patient   RxID:   [id number]     Current Allergies (reviewed today): ! * RED DYE   Not Administered:    Tetanus Vaccine not given due to: PATIENT DID NOT WANT

## 2010-08-23 NOTE — Progress Notes (Signed)
Summary: TO GINA  Phone Note Call from Patient Call back at Home Phone [phone]   Caller: Spouse-charles-VOICE MAIL Call For: dr Scotty Court Reason for Call: Lab or Test Results Summary of Call: HUSBAND STATED THAT [name] WOULD NOT BE AT HOME UNTIL AFTER 5PM . PLEASE CALL HER AGAIN ABOUT HER RESULTS. Initial call taken by: Warnell Forester,  December 26, 2007 4:03 PM  Follow-up for Phone Call        results given Follow-up by: Pura Spice, RN,  December 26, 2007 4:55 PM

## 2010-08-25 NOTE — Progress Notes (Signed)
Summary: Needs Lab Orders  Phone Note Call from Patient   Caller: Patient Summary of Call: Pt was told to have repeat labs in 3 mnths due to starting Crestor.  Needs an appt now, please advise what labs need to be done along with dx. Initial call taken by: Trixie Dredge,  August 03, 2010 9:27 AM  Follow-up for Phone Call        Needs lipids and hepatic function Follow-up by: Judithann Sheen MD,  August 04, 2010 9:54 AM

## 2010-12-15 ENCOUNTER — Other Ambulatory Visit (INDEPENDENT_AMBULATORY_CARE_PROVIDER_SITE_OTHER): Payer: Managed Care, Other (non HMO)

## 2010-12-15 DIAGNOSIS — Z0000 Encounter for general adult medical examination without abnormal findings: Secondary | ICD-10-CM

## 2010-12-15 LAB — LIPID PANEL
Cholesterol: 170 mg/dL (ref 0–200)
HDL: 74.8 mg/dL (ref >39.00)
LDL Cholesterol: 73 mg/dL (ref 0–99)
Total CHOL/HDL Ratio: 2
Triglycerides: 109 mg/dL (ref 0.0–149.0)
VLDL: 21.8 mg/dL (ref 0.0–40.0)

## 2010-12-15 LAB — BASIC METABOLIC PANEL
BUN: 19 mg/dL (ref 6–23)
CO2: 25 mEq/L (ref 19–32)
Calcium: 9.6 mg/dL (ref 8.4–10.5)
Chloride: 104 mEq/L (ref 96–112)
Creatinine, Ser: 1 mg/dL (ref 0.4–1.2)
GFR: 61.86 mL/min (ref >60.00)
Glucose, Bld: 84 mg/dL (ref 70–99)
Potassium: 5.8 mEq/L — ABNORMAL HIGH (ref 3.5–5.1)
Sodium: 140 mEq/L (ref 135–145)

## 2010-12-15 LAB — CBC WITH DIFFERENTIAL/PLATELET
Basophils Absolute: 0 10*3/uL (ref 0.0–0.1)
Basophils Relative: 0.5 % (ref 0.0–3.0)
Eosinophils Absolute: 0.1 10*3/uL (ref 0.0–0.7)
Eosinophils Relative: 0.8 % (ref 0.0–5.0)
HCT: 40.8 % (ref 36.0–46.0)
Hemoglobin: 14.1 g/dL (ref 12.0–15.0)
Lymphocytes Relative: 23.3 % (ref 12.0–46.0)
Lymphs Abs: 1.9 10*3/uL (ref 0.7–4.0)
MCHC: 34.6 g/dL (ref 30.0–36.0)
MCV: 92.8 fl (ref 78.0–100.0)
Monocytes Absolute: 0.6 10*3/uL (ref 0.1–1.0)
Monocytes Relative: 7 % (ref 3.0–12.0)
Neutro Abs: 5.6 10*3/uL (ref 1.4–7.7)
Neutrophils Relative %: 68.4 % (ref 43.0–77.0)
Platelets: 338 10*3/uL (ref 150.0–400.0)
RBC: 4.4 Mil/uL (ref 3.87–5.11)
RDW: 12.9 % (ref 11.5–14.6)
WBC: 8.3 10*3/uL (ref 4.5–10.5)

## 2010-12-15 LAB — POCT URINALYSIS DIPSTICK
Bilirubin, UA: NEGATIVE
Glucose, UA: NEGATIVE
Ketones, UA: NEGATIVE
Leukocytes, UA: NEGATIVE
Nitrite, UA: NEGATIVE
Protein, UA: NEGATIVE
Spec Grav, UA: 1.015
Urobilinogen, UA: 0.2
pH, UA: 6.5

## 2010-12-15 LAB — HEPATIC FUNCTION PANEL
ALT: 14 U/L (ref 0–35)
AST: 19 U/L (ref 0–37)
Albumin: 3.7 g/dL (ref 3.5–5.2)
Alkaline Phosphatase: 81 U/L (ref 39–117)
Bilirubin, Direct: 0.1 mg/dL (ref 0.0–0.3)
Total Bilirubin: 0.3 mg/dL (ref 0.3–1.2)
Total Protein: 7 g/dL (ref 6.0–8.3)

## 2010-12-15 LAB — TSH: TSH: 3.01 u[IU]/mL (ref 0.35–5.50)

## 2010-12-22 ENCOUNTER — Encounter: Payer: Self-pay | Admitting: Family Medicine

## 2010-12-22 ENCOUNTER — Ambulatory Visit (INDEPENDENT_AMBULATORY_CARE_PROVIDER_SITE_OTHER): Payer: Managed Care, Other (non HMO) | Admitting: Family Medicine

## 2010-12-22 VITALS — BP 110/80 | HR 86 | Temp 98.5°F | Ht 66.5 in | Wt 145.0 lb

## 2010-12-22 DIAGNOSIS — Z0000 Encounter for general adult medical examination without abnormal findings: Secondary | ICD-10-CM

## 2010-12-22 DIAGNOSIS — Z23 Encounter for immunization: Secondary | ICD-10-CM

## 2010-12-22 DIAGNOSIS — Z780 Asymptomatic menopausal state: Secondary | ICD-10-CM

## 2010-12-22 DIAGNOSIS — N951 Menopausal and female climacteric states: Secondary | ICD-10-CM

## 2010-12-22 DIAGNOSIS — E785 Hyperlipidemia, unspecified: Secondary | ICD-10-CM

## 2010-12-22 MED ORDER — CONJ ESTROG-MEDROXYPROGEST ACE 0.3-1.5 MG PO TABS: 1.0000 | ORAL_TABLET | Freq: Every day | ORAL | Status: DC

## 2010-12-22 MED ORDER — LEVOTHYROXINE SODIUM 100 MCG PO TABS: 100.0000 ug | ORAL_TABLET | Freq: Every day | ORAL | Status: DC

## 2010-12-22 MED ORDER — TRIAMCINOLONE ACETONIDE(NASAL) 55 MCG/ACT NA INHA: 1.0000 | Freq: Two times a day (BID) | NASAL | Status: DC

## 2010-12-22 MED ORDER — ESTROGENS, CONJUGATED 0.625 MG/GM VA CREA: 0.5000 g | TOPICAL_CREAM | Freq: Every day | VAGINAL | Status: AC

## 2010-12-22 MED ORDER — ROSUVASTATIN CALCIUM 10 MG PO TABS: 10.0000 mg | ORAL_TABLET | Freq: Every day | ORAL | Status: DC

## 2010-12-22 MED ORDER — DICLOFENAC SODIUM 75 MG PO TBEC: 75.0000 mg | DELAYED_RELEASE_TABLET | Freq: Two times a day (BID) | ORAL | Status: DC

## 2010-12-28 NOTE — Patient Instructions (Signed)
Lab studies were good continued treatment for hypothyroidism as well as hyperlipidemia continue Prempro 0.3-1.54 postmenopausal syndrome Have given you a tetanus T.dap today

## 2010-12-28 NOTE — Progress Notes (Signed)
  Subjective:    Patient ID: Stacy Gillespie, female    DOB: 27-Jan-1951, 60 y.o.   MRN: [id number] this 60 year old white married female is in for complete physical examination her only complaint is of pain in the left big toe episodically relate has had Pap smearNo other complaintHPI Patient is taken Prempro 0.5-152 reduced at 43 and taken medication for postmenopausal syndrome    Review of Systems  Constitutional: Negative.   HENT: Positive for congestion.   Eyes: Negative.   Respiratory: Negative.   Cardiovascular: Negative.   Gastrointestinal: Negative.   Genitourinary: Negative.   Musculoskeletal: Positive for joint swelling.  Skin: Negative.   Neurological: Negative.   Hematological: Negative.   Psychiatric/Behavioral: Negative.        Objective:   Physical Exam The patient is a well-developed well-nourished white female who is cooperative hour no distress HEENT negative carotid pulses good thyroid nonpalpable Lungs clear to palpation percussion and auscultation no rales no dullness no wheezing Heart no evidence of cardiomegaly heart sounds good without murmurs regular rhythm Breast full no masses felt no tenderness nipples everted axilla clear no lymphadenopathy Abdomen liver spleen kidneys nonpalpable no masses aorta percusses normal no tenderness normal bowel sounds Pelvic and rectal not done Extremities examination of the left big toe reveals no tenderness nor abnormality noted Neurological exam negative Skin negative       Assessment & Plan:  Physical examination reveals healthy female Postmenopausal syndrome taking Prempro oh 0.3-1.5 mg per tablet per day Hypothyroidism continue 100 mcg Synthroid Hyperlipidemia continue 10 mg tabletq.d.

## 2011-05-10 ENCOUNTER — Other Ambulatory Visit: Payer: Self-pay | Admitting: Obstetrics and Gynecology

## 2011-05-10 DIAGNOSIS — Z1231 Encounter for screening mammogram for malignant neoplasm of breast: Secondary | ICD-10-CM

## 2011-05-11 ENCOUNTER — Ambulatory Visit (INDEPENDENT_AMBULATORY_CARE_PROVIDER_SITE_OTHER): Payer: Managed Care, Other (non HMO)

## 2011-05-11 DIAGNOSIS — Z23 Encounter for immunization: Secondary | ICD-10-CM

## 2011-06-19 ENCOUNTER — Ambulatory Visit
Admission: RE | Admit: 2011-06-19 | Discharge: 2011-06-19 | Disposition: A | Discharge status: Home / Self Care | Payer: Managed Care, Other (non HMO) | Source: Ambulatory Visit | Attending: Obstetrics and Gynecology | Admitting: Obstetrics and Gynecology

## 2011-06-19 DIAGNOSIS — Z1231 Encounter for screening mammogram for malignant neoplasm of breast: Secondary | ICD-10-CM

## 2011-06-22 ENCOUNTER — Encounter: Payer: Self-pay | Admitting: Obstetrics and Gynecology

## 2011-06-22 ENCOUNTER — Ambulatory Visit (INDEPENDENT_AMBULATORY_CARE_PROVIDER_SITE_OTHER): Payer: Managed Care, Other (non HMO) | Admitting: Obstetrics and Gynecology

## 2011-06-22 ENCOUNTER — Other Ambulatory Visit (HOSPITAL_COMMUNITY)
Admission: RE | Admit: 2011-06-22 | Discharge: 2011-06-22 | Disposition: A | Discharge status: Home / Self Care | Payer: Managed Care, Other (non HMO) | Source: Ambulatory Visit | Attending: Obstetrics and Gynecology | Admitting: Obstetrics and Gynecology

## 2011-06-22 VITALS — BP 124/74 | Ht 67.0 in | Wt 145.0 lb

## 2011-06-22 DIAGNOSIS — R823 Hemoglobinuria: Secondary | ICD-10-CM

## 2011-06-22 DIAGNOSIS — Z01419 Encounter for gynecological examination (general) (routine) without abnormal findings: Secondary | ICD-10-CM | POA: Insufficient documentation

## 2011-06-22 MED ORDER — CONJ ESTROG-MEDROXYPROGEST ACE 0.3-1.5 MG PO TABS: 1.0000 | ORAL_TABLET | Freq: Every day | ORAL | Status: DC

## 2011-06-22 NOTE — Progress Notes (Signed)
Addended byCammie Mcgee T on: 06/22/2011 02:37 PM   Modules accepted: Orders

## 2011-06-22 NOTE — Progress Notes (Signed)
Patient came to see me today for her annual GYN exam. She sees Dr. Scotty Court for her medical exams and he does her lab work. She remains on HRT which makes her feel much better as well as Premarin vaginal cream for vaginal dryness. She is having no bleeding with. She is having no pelvic pain. She is up-to-date both on mammograms and bone densities. We have reduced her Prempro over the years.  Physical examination:  Kennon Portela present HEENT within normal limits. Neck: Thyroid not large. No masses. Supraclavicular nodes: not enlarged. Breasts: Examined in both sitting midline position. No skin changes and no masses. Abdomen: Soft no guarding rebound or masses or hernia. Pelvic: External: Within normal limits. BUS: Within normal limits. Vaginal:within normal limits. Good estrogen effect. No evidence of cystocele rectocele or enterocele. Cervix: clean. Uterus: Normal size and shape. Adnexa: No masses. Rectovaginal exam: Confirmatory and negative. Extremities: Within normal limits.  Assessment: Menopausal symptoms  Plan: Discussed benefits and risks of HRT. We discussed a higher risk of breast cancer. Patient would like to continue HRT. Continue Prempro 0.3 mg. Continue Premarin vaginal cream. Continue yearly mammograms.

## 2011-09-20 ENCOUNTER — Telehealth: Payer: Self-pay | Admitting: Family Medicine

## 2011-09-20 NOTE — Telephone Encounter (Signed)
Pt hus(Stacy Gillespie) sees Dr Clent Ridges and in dec 2012 per wife Dr Clent Ridges agreed to see her as new pt from Dr Scotty Court. Please confirm

## 2011-09-20 NOTE — Telephone Encounter (Signed)
I will agree to see her as well, thanks

## 2011-09-22 NOTE — Telephone Encounter (Signed)
Line busy 09-22-2011

## 2011-09-25 NOTE — Telephone Encounter (Signed)
Pt is aware new pcp dr Clent Ridges

## 2011-12-25 ENCOUNTER — Other Ambulatory Visit (INDEPENDENT_AMBULATORY_CARE_PROVIDER_SITE_OTHER): Payer: Managed Care, Other (non HMO)

## 2011-12-25 DIAGNOSIS — Z0000 Encounter for general adult medical examination without abnormal findings: Secondary | ICD-10-CM

## 2011-12-25 LAB — LIPID PANEL
Cholesterol: 181 mg/dL (ref 0–200)
HDL: 76.4 mg/dL (ref >39.00)
LDL Cholesterol: 84 mg/dL (ref 0–99)
Total CHOL/HDL Ratio: 2
Triglycerides: 103 mg/dL (ref 0.0–149.0)
VLDL: 20.6 mg/dL (ref 0.0–40.0)

## 2011-12-25 LAB — POCT URINALYSIS DIPSTICK
Bilirubin, UA: NEGATIVE
Glucose, UA: NEGATIVE
Ketones, UA: NEGATIVE
Leukocytes, UA: NEGATIVE
Nitrite, UA: NEGATIVE
Protein, UA: NEGATIVE
Spec Grav, UA: 1.02
Urobilinogen, UA: 0.2
pH, UA: 6

## 2011-12-25 LAB — BASIC METABOLIC PANEL
BUN: 22 mg/dL (ref 6–23)
CO2: 25 mEq/L (ref 19–32)
Calcium: 9.2 mg/dL (ref 8.4–10.5)
Chloride: 104 mEq/L (ref 96–112)
Creatinine, Ser: 0.9 mg/dL (ref 0.4–1.2)
GFR: 64.73 mL/min (ref >60.00)
Glucose, Bld: 79 mg/dL (ref 70–99)
Potassium: 4.6 mEq/L (ref 3.5–5.1)
Sodium: 139 mEq/L (ref 135–145)

## 2011-12-25 LAB — CBC WITH DIFFERENTIAL/PLATELET
Basophils Absolute: 0 10*3/uL (ref 0.0–0.1)
Basophils Relative: 0.3 % (ref 0.0–3.0)
Eosinophils Absolute: 0.1 10*3/uL (ref 0.0–0.7)
Eosinophils Relative: 0.9 % (ref 0.0–5.0)
HCT: 40.5 % (ref 36.0–46.0)
Hemoglobin: 13.4 g/dL (ref 12.0–15.0)
Lymphocytes Relative: 17.5 % (ref 12.0–46.0)
Lymphs Abs: 1.8 10*3/uL (ref 0.7–4.0)
MCHC: 33.2 g/dL (ref 30.0–36.0)
MCV: 92.7 fl (ref 78.0–100.0)
Monocytes Absolute: 0.5 10*3/uL (ref 0.1–1.0)
Monocytes Relative: 5.1 % (ref 3.0–12.0)
Neutro Abs: 8 10*3/uL — ABNORMAL HIGH (ref 1.4–7.7)
Neutrophils Relative %: 76.2 % (ref 43.0–77.0)
Platelets: 338 10*3/uL (ref 150.0–400.0)
RBC: 4.36 Mil/uL (ref 3.87–5.11)
RDW: 13.3 % (ref 11.5–14.6)
WBC: 10.5 10*3/uL (ref 4.5–10.5)

## 2011-12-25 LAB — HEPATIC FUNCTION PANEL
ALT: 11 U/L (ref 0–35)
AST: 16 U/L (ref 0–37)
Albumin: 3.6 g/dL (ref 3.5–5.2)
Alkaline Phosphatase: 88 U/L (ref 39–117)
Bilirubin, Direct: 0 mg/dL (ref 0.0–0.3)
Total Bilirubin: 0.4 mg/dL (ref 0.3–1.2)
Total Protein: 7 g/dL (ref 6.0–8.3)

## 2011-12-25 LAB — TSH: TSH: 2.72 u[IU]/mL (ref 0.35–5.50)

## 2011-12-27 NOTE — Progress Notes (Signed)
Quick Note:  I left voice message with normal results. ______ 

## 2012-01-01 ENCOUNTER — Encounter: Payer: Self-pay | Admitting: Family Medicine

## 2012-01-01 ENCOUNTER — Ambulatory Visit (INDEPENDENT_AMBULATORY_CARE_PROVIDER_SITE_OTHER): Payer: Managed Care, Other (non HMO) | Admitting: Family Medicine

## 2012-01-01 VITALS — BP 112/74 | HR 99 | Temp 98.8°F | Ht 67.0 in | Wt 142.0 lb

## 2012-01-01 DIAGNOSIS — J449 Chronic obstructive pulmonary disease, unspecified: Secondary | ICD-10-CM

## 2012-01-01 DIAGNOSIS — Z0000 Encounter for general adult medical examination without abnormal findings: Secondary | ICD-10-CM

## 2012-01-01 MED ORDER — TRIAMCINOLONE ACETONIDE(NASAL) 55 MCG/ACT NA INHA: 1.0000 | Freq: Two times a day (BID) | NASAL | Status: DC

## 2012-01-01 MED ORDER — LEVOTHYROXINE SODIUM 100 MCG PO TABS: 100.0000 ug | ORAL_TABLET | Freq: Every day | ORAL | Status: DC

## 2012-01-01 MED ORDER — ROSUVASTATIN CALCIUM 10 MG PO TABS: 10.0000 mg | ORAL_TABLET | Freq: Every day | ORAL | Status: DC

## 2012-01-01 NOTE — Progress Notes (Signed)
  Subjective:    Patient ID: Stacy Gillespie, female    DOB: 03/27/1951, 61 y.o.   MRN: [id number]  HPI 61 yr old female for a cpx. She feels fine ans has no concerns. She is past due for a colonoscopy. Her last CXR was 2 years ago. She has cut her smoking down to 1/2 a ppd.    Review of Systems  Constitutional: Negative.   HENT: Negative.   Eyes: Negative.   Respiratory: Negative.   Cardiovascular: Negative.   Gastrointestinal: Negative.   Genitourinary: Negative for dysuria, urgency, frequency, hematuria, flank pain, decreased urine volume, enuresis, difficulty urinating, pelvic pain and dyspareunia.  Musculoskeletal: Negative.   Skin: Negative.   Neurological: Negative.   Hematological: Negative.   Psychiatric/Behavioral: Negative.        Objective:   Physical Exam  Constitutional: She is oriented to person, place, and time. She appears well-developed and well-nourished. No distress.  HENT:  Head: Normocephalic and atraumatic.  Right Ear: External ear normal.  Left Ear: External ear normal.  Nose: Nose normal.  Mouth/Throat: Oropharynx is clear and moist. No oropharyngeal exudate.  Eyes: Conjunctivae and EOM are normal. Pupils are equal, round, and reactive to light. No scleral icterus.  Neck: Normal range of motion. Neck supple. No JVD present. No thyromegaly present.  Cardiovascular: Normal rate, regular rhythm, normal heart sounds and intact distal pulses.  Exam reveals no gallop and no friction rub.   No murmur heard. Pulmonary/Chest: Effort normal and breath sounds normal. No respiratory distress. She has no wheezes. She has no rales. She exhibits no tenderness.  Abdominal: Soft. Bowel sounds are normal. She exhibits no distension and no mass. There is no tenderness. There is no rebound and no guarding.  Musculoskeletal: Normal range of motion. She exhibits no edema and no tenderness.  Lymphadenopathy:    She has no cervical adenopathy.  Neurological: She is alert and  oriented to person, place, and time. She has normal reflexes. No cranial nerve deficit. She exhibits normal muscle tone. Coordination normal.  Skin: Skin is warm and dry. No rash noted. No erythema.  Psychiatric: She has a normal mood and affect. Her behavior is normal. Judgment and thought content normal.          Assessment & Plan:  Well exam. Set up another colonoscopy. Get a CXR. She needs to stop smoking.

## 2012-01-02 ENCOUNTER — Encounter: Payer: Self-pay | Admitting: Gastroenterology

## 2012-01-04 ENCOUNTER — Ambulatory Visit (INDEPENDENT_AMBULATORY_CARE_PROVIDER_SITE_OTHER)
Admission: RE | Admit: 2012-01-04 | Discharge: 2012-01-04 | Disposition: A | Discharge status: Home / Self Care | Payer: Managed Care, Other (non HMO) | Source: Ambulatory Visit | Attending: Family Medicine | Admitting: Family Medicine

## 2012-01-04 DIAGNOSIS — J449 Chronic obstructive pulmonary disease, unspecified: Secondary | ICD-10-CM

## 2012-01-05 NOTE — Progress Notes (Signed)
Quick Note:  Left voice message with results. ______ 

## 2012-02-12 ENCOUNTER — Encounter: Payer: Self-pay | Admitting: Gastroenterology

## 2012-02-12 ENCOUNTER — Ambulatory Visit (AMBULATORY_SURGERY_CENTER): Payer: Managed Care, Other (non HMO) | Admitting: *Deleted

## 2012-02-12 VITALS — Ht 67.0 in | Wt 143.2 lb

## 2012-02-12 DIAGNOSIS — Z1211 Encounter for screening for malignant neoplasm of colon: Secondary | ICD-10-CM

## 2012-02-12 MED ORDER — MOVIPREP 100 G PO SOLR: ORAL | Status: DC

## 2012-02-12 NOTE — Progress Notes (Signed)
Pt says she had problems waking up from anesthesia after surgery 20 years ago.  Had problems with mobility and urinating 36 hrs after surgery.  I talked with Cathlyn Parsons, CRNA.  Pt okay for propofol sedation.

## 2012-02-26 ENCOUNTER — Encounter: Payer: Self-pay | Admitting: Gastroenterology

## 2012-02-26 ENCOUNTER — Ambulatory Visit (AMBULATORY_SURGERY_CENTER): Payer: Managed Care, Other (non HMO) | Admitting: Gastroenterology

## 2012-02-26 VITALS — BP 133/68 | HR 77 | Temp 96.7°F | Resp 17 | Ht 67.0 in | Wt 143.0 lb

## 2012-02-26 DIAGNOSIS — Z1211 Encounter for screening for malignant neoplasm of colon: Secondary | ICD-10-CM

## 2012-02-26 HISTORY — PX: COLONOSCOPY: SHX174

## 2012-02-26 MED ORDER — SODIUM CHLORIDE 0.9 % IV SOLN: 500.0000 mL | INTRAVENOUS | Status: DC

## 2012-02-26 NOTE — Patient Instructions (Addendum)
Discharge instructions given with verbal understanding. Handouts on hemorrhoids and a high fiber diet given. Resume previous medications. YOU HAD AN ENDOSCOPIC PROCEDURE TODAY AT THE [facility]: Refer to the procedure report that was given to you for any specific questions about what was found during the examination.  If the procedure report does not answer your questions, please call your gastroenterologist to clarify.  If you requested that your care partner not be given the details of your procedure findings, then the procedure report has been included in a sealed envelope for you to review at your convenience later.  YOU SHOULD EXPECT: Some feelings of bloating in the abdomen. Passage of more gas than usual.  Walking can help get rid of the air that was put into your GI tract during the procedure and reduce the bloating. If you had a lower endoscopy (such as a colonoscopy or flexible sigmoidoscopy) you may notice spotting of blood in your stool or on the toilet paper. If you underwent a bowel prep for your procedure, then you may not have a normal bowel movement for a few days.  DIET: Your first meal following the procedure should be a light meal and then it is ok to progress to your normal diet.  A half-sandwich or bowl of soup is an example of a good first meal.  Heavy or fried foods are harder to digest and may make you feel nauseous or bloated.  Likewise meals heavy in dairy and vegetables can cause extra gas to form and this can also increase the bloating.  Drink plenty of fluids but you should avoid alcoholic beverages for 24 hours.  ACTIVITY: Your care partner should take you home directly after the procedure.  You should plan to take it easy, moving slowly for the rest of the day.  You can resume normal activity the day after the procedure however you should NOT DRIVE or use heavy machinery for 24 hours (because of the sedation medicines used during the test).    SYMPTOMS TO  REPORT IMMEDIATELY: A gastroenterologist can be reached at any hour.  During normal business hours, 8:30 AM to 5:00 PM Monday through Friday, call [phone].  After hours and on weekends, please call the GI answering service at [phone] who will take a message and have the physician on call contact you.   Following lower endoscopy (colonoscopy or flexible sigmoidoscopy):  Excessive amounts of blood in the stool  Significant tenderness or worsening of abdominal pains  Swelling of the abdomen that is new, acute  Fever of 100F or higher FOLLOW UP: If any biopsies were taken you will be contacted by phone or by letter within the next 1-3 weeks.  Call your gastroenterologist if you have not heard about the biopsies in 3 weeks.  Our staff will call the home number listed on your records the next business day following your procedure to check on you and address any questions or concerns that you may have at that time regarding the information given to you following your procedure. This is a courtesy call and so if there is no answer at the home number and we have not heard from you through the emergency physician on call, we will assume that you have returned to your regular daily activities without incident.  SIGNATURES/CONFIDENTIALITY: You and/or your care partner have signed paperwork which will be entered into your electronic medical record.  These signatures attest to the fact that that the information above on your   After Visit Summary has been reviewed and is understood.  Full responsibility of the confidentiality of this discharge information lies with you and/or your care-partner.  

## 2012-02-26 NOTE — Op Note (Signed)
[facility] [address] [place], [place]  [address]  COLONOSCOPY PROCEDURE REPORT  PATIENT:  Stacy, Gillespie  MR#:  [id number] BIRTHDATE:  1951-07-10, 61 yrs. old  GENDER:  female ENDOSCOPIST:  Judie Petit T. Russella Dar, MD, [facility]  PROCEDURE DATE:  02/26/2012 PROCEDURE:  Colonoscopy 19147 ASA CLASS:  Class II INDICATIONS:  1) Routine Risk Screening MEDICATIONS:   MAC sedation, administered by CRNA, propofol (Diprivan) 150 mg IV DESCRIPTION OF PROCEDURE:   After the risks benefits and alternatives of the procedure were thoroughly explained, informed consent was obtained.  Digital rectal exam was performed and revealed no abnormalities.   The LB CF-H180AL [id number] endoscope was introduced through the anus and advanced to the cecum, which was identified by both the appendix and ileocecal valve, without limitations.  The quality of the prep was excellent, using MoviPrep.  The instrument was then slowly withdrawn as the colon was fully examined. <<PROCEDUREIMAGES>> FINDINGS:  A normal appearing cecum, ileocecal valve, and appendiceal orifice were identified. The ascending, hepatic flexure, transverse, splenic flexure, descending, sigmoid colon, and rectum appeared unremarkable.   Retroflexed views in the rectum revealed internal hemorrhoids, small.  The time to cecum = 2.5  minutes. The scope was then withdrawn (time =  7.5  min) from the patient and the procedure completed.  COMPLICATIONS:  None  ENDOSCOPIC IMPRESSION: 1) Normal colon 2) Internal hemorrhoids  RECOMMENDATIONS: 1) Continue current colorectal screening for "routine risk" patients with a repeat colonoscopy in 10 years.  Venita Lick. Russella Dar, MD, [name]  n. eSIGNED:   Venita Lick. [name] at 02/26/2012 09:42 AM  [name], [phone]

## 2012-02-26 NOTE — Progress Notes (Signed)
Patient did not experience any of the following events: a burn prior to discharge; a fall within the facility; wrong site/side/patient/procedure/implant event; or a hospital transfer or hospital admission upon discharge from the facility. (G8907) Patient did not have preoperative order for IV antibiotic SSI prophylaxis. (G8918)  

## 2012-02-26 NOTE — Progress Notes (Signed)
PROPOFOL PER S CAMP CRNA. SEE SCANNED INTRA PROCEDURE REPORT. EWM 

## 2012-02-27 ENCOUNTER — Telehealth: Payer: Self-pay | Admitting: *Deleted

## 2012-02-27 NOTE — Telephone Encounter (Signed)
  Follow up Call-  Call back number 02/26/2012  Post procedure Call Back phone  # [phone]  Permission to leave phone message Yes     Patient questions:  Do you have a fever, pain , or abdominal swelling? no Pain Score  0 *  Have you tolerated food without any problems? yes  Have you been able to return to your normal activities? yes  Do you have any questions about your discharge instructions: Diet   no Medications  no Follow up visit  no  Do you have questions or concerns about your Care? no  Actions: * If pain score is 4 or above: No action needed, pain <4.

## 2012-05-08 ENCOUNTER — Ambulatory Visit (INDEPENDENT_AMBULATORY_CARE_PROVIDER_SITE_OTHER): Payer: Managed Care, Other (non HMO)

## 2012-05-08 DIAGNOSIS — Z23 Encounter for immunization: Secondary | ICD-10-CM

## 2012-05-13 ENCOUNTER — Other Ambulatory Visit: Payer: Self-pay | Admitting: Obstetrics and Gynecology

## 2012-05-13 DIAGNOSIS — Z1231 Encounter for screening mammogram for malignant neoplasm of breast: Secondary | ICD-10-CM

## 2012-06-19 ENCOUNTER — Ambulatory Visit
Admission: RE | Admit: 2012-06-19 | Discharge: 2012-06-19 | Disposition: A | Discharge status: Home / Self Care | Payer: Managed Care, Other (non HMO) | Source: Ambulatory Visit | Attending: Obstetrics and Gynecology | Admitting: Obstetrics and Gynecology

## 2012-06-19 DIAGNOSIS — Z1231 Encounter for screening mammogram for malignant neoplasm of breast: Secondary | ICD-10-CM

## 2012-06-24 ENCOUNTER — Encounter: Payer: Self-pay | Admitting: Obstetrics and Gynecology

## 2012-06-24 ENCOUNTER — Ambulatory Visit (INDEPENDENT_AMBULATORY_CARE_PROVIDER_SITE_OTHER): Payer: Managed Care, Other (non HMO) | Admitting: Obstetrics and Gynecology

## 2012-06-24 VITALS — BP 124/78 | Ht 66.0 in | Wt 143.0 lb

## 2012-06-24 DIAGNOSIS — Z01419 Encounter for gynecological examination (general) (routine) without abnormal findings: Secondary | ICD-10-CM

## 2012-06-24 MED ORDER — CONJ ESTROG-MEDROXYPROGEST ACE 0.3-1.5 MG PO TABS: 1.0000 | ORAL_TABLET | Freq: Every day | ORAL | Status: DC

## 2012-06-24 MED ORDER — ESTROGENS, CONJUGATED 0.625 MG/GM VA CREA: TOPICAL_CREAM | Freq: Every day | VAGINAL | Status: DC

## 2012-06-24 NOTE — Patient Instructions (Signed)
Call the breast center to see if you need additional views.

## 2012-06-24 NOTE — Progress Notes (Signed)
Patient came to see me today for an annual GYN exam. She remains on HRT as well as vaginal estrogen. She has been on it for 12-13 years. We have been able to taper her dose  but she feels some need to continue. She just had a mammogram. There was some question on the  report whether she needed followup views so she will call the breast center and we will call as well. She's never had an abnormal Pap smear. Her last Pap smear was 2012. She does her bone densities through Dr. Daisey Must. He also does her lab work. She is having no vaginal bleeding. She is having no pelvic pain.  Physical examination:Kim Julian Reil present. HEENT within normal limits. Neck: Thyroid not large. No masses. Supraclavicular nodes: not enlarged. Breasts: Examined in both sitting and lying  position. No skin changes and no masses. Abdomen: Soft no guarding rebound or masses or hernia. Pelvic: External: Within normal limits. BUS: Within normal limits. Vaginal:within normal limits. Good estrogen effect. No evidence of cystocele rectocele or enterocele. Cervix: clean. Uterus: Normal size and shape. Adnexa: No masses. Rectovaginal exam: Confirmatory and negative. Extremities: Within normal limits.  Assessment: Menopausal symptoms  Plan: Patient will call breast Center to see about additional breast imaging. Once again we discussed the higher-risk of breast cancer with HRT. She is going to try to take her Prempro 0.3 mg every other day. She will continue estrogen cream.Consider estrogen-serm when available next year Pap not done.The new Pap smear guidelines were discussed with the patient.

## 2012-06-25 ENCOUNTER — Other Ambulatory Visit: Payer: Self-pay | Admitting: *Deleted

## 2012-06-25 DIAGNOSIS — N63 Unspecified lump in unspecified breast: Secondary | ICD-10-CM

## 2012-06-25 LAB — URINALYSIS W MICROSCOPIC + REFLEX CULTURE

## 2012-07-02 ENCOUNTER — Ambulatory Visit
Admission: RE | Admit: 2012-07-02 | Discharge: 2012-07-02 | Disposition: A | Discharge status: Home / Self Care | Payer: Managed Care, Other (non HMO) | Source: Ambulatory Visit | Attending: Obstetrics and Gynecology | Admitting: Obstetrics and Gynecology

## 2012-07-02 ENCOUNTER — Other Ambulatory Visit: Payer: Managed Care, Other (non HMO)

## 2012-07-02 DIAGNOSIS — N63 Unspecified lump in unspecified breast: Secondary | ICD-10-CM

## 2012-12-25 ENCOUNTER — Other Ambulatory Visit (INDEPENDENT_AMBULATORY_CARE_PROVIDER_SITE_OTHER): Payer: Managed Care, Other (non HMO)

## 2012-12-25 DIAGNOSIS — Z0000 Encounter for general adult medical examination without abnormal findings: Secondary | ICD-10-CM

## 2012-12-25 LAB — CBC WITH DIFFERENTIAL/PLATELET
Basophils Absolute: 0 10*3/uL (ref 0.0–0.1)
Basophils Relative: 0.4 % (ref 0.0–3.0)
Eosinophils Absolute: 0.2 10*3/uL (ref 0.0–0.7)
Eosinophils Relative: 1.5 % (ref 0.0–5.0)
HCT: 39.3 % (ref 36.0–46.0)
Hemoglobin: 13 g/dL (ref 12.0–15.0)
Lymphocytes Relative: 21 % (ref 12.0–46.0)
Lymphs Abs: 2.3 10*3/uL (ref 0.7–4.0)
MCHC: 33.2 g/dL (ref 30.0–36.0)
MCV: 93 fl (ref 78.0–100.0)
Monocytes Absolute: 0.7 10*3/uL (ref 0.1–1.0)
Monocytes Relative: 6.5 % (ref 3.0–12.0)
Neutro Abs: 7.6 10*3/uL (ref 1.4–7.7)
Neutrophils Relative %: 70.6 % (ref 43.0–77.0)
Platelets: 314 10*3/uL (ref 150.0–400.0)
RBC: 4.23 Mil/uL (ref 3.87–5.11)
RDW: 12.8 % (ref 11.5–14.6)
WBC: 10.8 10*3/uL — ABNORMAL HIGH (ref 4.5–10.5)

## 2012-12-25 LAB — LIPID PANEL
Cholesterol: 165 mg/dL (ref 0–200)
HDL: 59.2 mg/dL (ref >39.00)
LDL Cholesterol: 95 mg/dL (ref 0–99)
Total CHOL/HDL Ratio: 3
Triglycerides: 54 mg/dL (ref 0.0–149.0)
VLDL: 10.8 mg/dL (ref 0.0–40.0)

## 2012-12-25 LAB — POCT URINALYSIS DIPSTICK
Bilirubin, UA: NEGATIVE
Glucose, UA: NEGATIVE
Leukocytes, UA: NEGATIVE
Nitrite, UA: NEGATIVE
Protein, UA: NEGATIVE
Spec Grav, UA: >=1.03
Urobilinogen, UA: 0.2
pH, UA: 5.5

## 2012-12-25 LAB — BASIC METABOLIC PANEL
BUN: 24 mg/dL — ABNORMAL HIGH (ref 6–23)
CO2: 25 mEq/L (ref 19–32)
Calcium: 9.4 mg/dL (ref 8.4–10.5)
Chloride: 107 mEq/L (ref 96–112)
Creatinine, Ser: 1.1 mg/dL (ref 0.4–1.2)
GFR: 54.88 mL/min — ABNORMAL LOW (ref >60.00)
Glucose, Bld: 92 mg/dL (ref 70–99)
Potassium: 4.7 mEq/L (ref 3.5–5.1)
Sodium: 141 mEq/L (ref 135–145)

## 2012-12-25 LAB — HEPATIC FUNCTION PANEL
ALT: 14 U/L (ref 0–35)
AST: 18 U/L (ref 0–37)
Albumin: 3.7 g/dL (ref 3.5–5.2)
Alkaline Phosphatase: 81 U/L (ref 39–117)
Bilirubin, Direct: 0.1 mg/dL (ref 0.0–0.3)
Total Bilirubin: 0.3 mg/dL (ref 0.3–1.2)
Total Protein: 7.1 g/dL (ref 6.0–8.3)

## 2012-12-25 LAB — TSH: TSH: 1.94 u[IU]/mL (ref 0.35–5.50)

## 2012-12-26 NOTE — Progress Notes (Signed)
Quick Note:  Pt has appointment on 01/01/13 will go over then. ______

## 2013-01-01 ENCOUNTER — Encounter: Payer: Self-pay | Admitting: Family Medicine

## 2013-01-01 ENCOUNTER — Ambulatory Visit (INDEPENDENT_AMBULATORY_CARE_PROVIDER_SITE_OTHER): Payer: Managed Care, Other (non HMO) | Admitting: Family Medicine

## 2013-01-01 VITALS — BP 116/62 | HR 89 | Temp 98.4°F | Ht 66.5 in | Wt 144.0 lb

## 2013-01-01 DIAGNOSIS — Z23 Encounter for immunization: Secondary | ICD-10-CM

## 2013-01-01 DIAGNOSIS — Z0000 Encounter for general adult medical examination without abnormal findings: Secondary | ICD-10-CM

## 2013-01-01 MED ORDER — DICLOFENAC SODIUM 75 MG PO TBEC: 75.0000 mg | DELAYED_RELEASE_TABLET | Freq: Two times a day (BID) | ORAL | Status: DC

## 2013-01-01 MED ORDER — LEVOTHYROXINE SODIUM 100 MCG PO TABS: 100.0000 ug | ORAL_TABLET | Freq: Every day | ORAL | Status: DC

## 2013-01-01 MED ORDER — TRIAMCINOLONE ACETONIDE(NASAL) 55 MCG/ACT NA INHA: 1.0000 | Freq: Two times a day (BID) | NASAL | Status: DC

## 2013-01-01 MED ORDER — ROSUVASTATIN CALCIUM 10 MG PO TABS: 10.0000 mg | ORAL_TABLET | Freq: Every day | ORAL | Status: DC

## 2013-01-01 NOTE — Addendum Note (Signed)
Addended by: Aniceto Boss A on: 01/01/2013 03:19 PM   Modules accepted: Orders

## 2013-01-01 NOTE — Progress Notes (Signed)
  Subjective:    Patient ID: Stacy Gillespie, female    DOB: 1951/01/04, 62 y.o.   MRN: [id number]  HPI 62 yr old female for a cpx. She has a few questions today. For 3 years she has had a painful knot on the left great toe. Also she still has hot flashes. She has been taking Prempro every other day for the past year. Since Dr. Eda Paschal retired, she will have [place] handle her GYN care.    Review of Systems  Constitutional: Negative.   HENT: Negative.   Eyes: Negative.   Respiratory: Negative.   Cardiovascular: Negative.   Gastrointestinal: Negative.   Genitourinary: Negative for dysuria, urgency, frequency, hematuria, flank pain, decreased urine volume, enuresis, difficulty urinating, pelvic pain and dyspareunia.  Musculoskeletal: Negative.   Skin: Negative.   Neurological: Negative.   Psychiatric/Behavioral: Negative.        Objective:   Physical Exam  Constitutional: She is oriented to person, place, and time. She appears well-developed and well-nourished. No distress.  HENT:  Head: Normocephalic and atraumatic.  Right Ear: External ear normal.  Left Ear: External ear normal.  Nose: Nose normal.  Mouth/Throat: Oropharynx is clear and moist. No oropharyngeal exudate.  Eyes: Conjunctivae and EOM are normal. Pupils are equal, round, and reactive to light. No scleral icterus.  Neck: Normal range of motion. Neck supple. No JVD present. No thyromegaly present.  Cardiovascular: Normal rate, regular rhythm, normal heart sounds and intact distal pulses.  Exam reveals no gallop and no friction rub.   No murmur heard. EKG normal   Pulmonary/Chest: Effort normal and breath sounds normal. No respiratory distress. She has no wheezes. She has no rales. She exhibits no tenderness.  Abdominal: Soft. Bowel sounds are normal. She exhibits no distension and no mass. There is no tenderness. There is no rebound and no guarding.  Musculoskeletal: Normal range of motion. She exhibits no edema and no  tenderness.  Lymphadenopathy:    She has no cervical adenopathy.  Neurological: She is alert and oriented to person, place, and time. She has normal reflexes. No cranial nerve deficit. She exhibits normal muscle tone. Coordination normal.  Skin: Skin is warm and dry. No rash noted. No erythema.  Psychiatric: She has a normal mood and affect. Her behavior is normal. Judgment and thought content normal.          Assessment & Plan:  Well exam. Advised her to take Prempro daily. She has a bony cyst at the left first MTP. She could have this surgically removed but she declined for the time being.

## 2013-04-28 ENCOUNTER — Emergency Department (HOSPITAL_COMMUNITY)
Admission: EM | Admit: 2013-04-28 | Discharge: 2013-04-28 | Disposition: A | Discharge status: Home / Self Care | Payer: Managed Care, Other (non HMO) | Attending: Emergency Medicine | Admitting: Emergency Medicine

## 2013-04-28 ENCOUNTER — Emergency Department (HOSPITAL_COMMUNITY): Payer: Managed Care, Other (non HMO)

## 2013-04-28 ENCOUNTER — Encounter (HOSPITAL_COMMUNITY): Payer: Self-pay | Admitting: Emergency Medicine

## 2013-04-28 DIAGNOSIS — R55 Syncope and collapse: Secondary | ICD-10-CM | POA: Insufficient documentation

## 2013-04-28 DIAGNOSIS — H538 Other visual disturbances: Secondary | ICD-10-CM | POA: Insufficient documentation

## 2013-04-28 DIAGNOSIS — E079 Disorder of thyroid, unspecified: Secondary | ICD-10-CM | POA: Insufficient documentation

## 2013-04-28 DIAGNOSIS — Y9301 Activity, walking, marching and hiking: Secondary | ICD-10-CM | POA: Insufficient documentation

## 2013-04-28 DIAGNOSIS — W19XXXA Unspecified fall, initial encounter: Secondary | ICD-10-CM

## 2013-04-28 DIAGNOSIS — J449 Chronic obstructive pulmonary disease, unspecified: Secondary | ICD-10-CM | POA: Insufficient documentation

## 2013-04-28 DIAGNOSIS — Z79899 Other long term (current) drug therapy: Secondary | ICD-10-CM | POA: Insufficient documentation

## 2013-04-28 DIAGNOSIS — M129 Arthropathy, unspecified: Secondary | ICD-10-CM | POA: Insufficient documentation

## 2013-04-28 DIAGNOSIS — R61 Generalized hyperhidrosis: Secondary | ICD-10-CM | POA: Insufficient documentation

## 2013-04-28 DIAGNOSIS — R42 Dizziness and giddiness: Secondary | ICD-10-CM | POA: Insufficient documentation

## 2013-04-28 DIAGNOSIS — M25559 Pain in unspecified hip: Secondary | ICD-10-CM | POA: Insufficient documentation

## 2013-04-28 DIAGNOSIS — Y92009 Unspecified place in unspecified non-institutional (private) residence as the place of occurrence of the external cause: Secondary | ICD-10-CM | POA: Insufficient documentation

## 2013-04-28 DIAGNOSIS — M545 Low back pain, unspecified: Secondary | ICD-10-CM | POA: Insufficient documentation

## 2013-04-28 DIAGNOSIS — J4489 Other specified chronic obstructive pulmonary disease: Secondary | ICD-10-CM | POA: Insufficient documentation

## 2013-04-28 DIAGNOSIS — W108XXA Fall (on) (from) other stairs and steps, initial encounter: Secondary | ICD-10-CM | POA: Insufficient documentation

## 2013-04-28 DIAGNOSIS — E785 Hyperlipidemia, unspecified: Secondary | ICD-10-CM | POA: Insufficient documentation

## 2013-04-28 DIAGNOSIS — F17200 Nicotine dependence, unspecified, uncomplicated: Secondary | ICD-10-CM | POA: Insufficient documentation

## 2013-04-28 HISTORY — DX: Unspecified osteoarthritis, unspecified site: M19.90

## 2013-04-28 LAB — URINALYSIS, ROUTINE W REFLEX MICROSCOPIC
Bilirubin Urine: NEGATIVE
Glucose, UA: NEGATIVE mg/dL
Hgb urine dipstick: NEGATIVE
Ketones, ur: 15 mg/dL — AB
Leukocytes, UA: NEGATIVE
Nitrite: NEGATIVE
Protein, ur: 30 mg/dL — AB
Specific Gravity, Urine: 1.015 (ref 1.005–1.030)
Urobilinogen, UA: 0.2 mg/dL (ref 0.0–1.0)
pH: 7 (ref 5.0–8.0)

## 2013-04-28 LAB — POCT I-STAT, CHEM 8
BUN: 11 mg/dL (ref 6–23)
Calcium, Ion: 1.1 mmol/L — ABNORMAL LOW (ref 1.13–1.30)
Chloride: 100 mEq/L (ref 96–112)
Creatinine, Ser: 0.9 mg/dL (ref 0.50–1.10)
Glucose, Bld: 108 mg/dL — ABNORMAL HIGH (ref 70–99)
HCT: 36 % (ref 36.0–46.0)
Hemoglobin: 12.2 g/dL (ref 12.0–15.0)
Potassium: 3.9 mEq/L (ref 3.5–5.1)
Sodium: 133 mEq/L — ABNORMAL LOW (ref 135–145)
TCO2: 22 mmol/L (ref 0–100)

## 2013-04-28 LAB — URINE MICROSCOPIC-ADD ON

## 2013-04-28 LAB — CBC WITH DIFFERENTIAL/PLATELET
Basophils Absolute: 0 10*3/uL (ref 0.0–0.1)
Basophils Relative: 0 % (ref 0–1)
Eosinophils Absolute: 0 10*3/uL (ref 0.0–0.7)
Eosinophils Relative: 0 % (ref 0–5)
HCT: 34.5 % — ABNORMAL LOW (ref 36.0–46.0)
Hemoglobin: 12.1 g/dL (ref 12.0–15.0)
Lymphocytes Relative: 7 % — ABNORMAL LOW (ref 12–46)
Lymphs Abs: 1 10*3/uL (ref 0.7–4.0)
MCH: 31 pg (ref 26.0–34.0)
MCHC: 35.1 g/dL (ref 30.0–36.0)
MCV: 88.5 fL (ref 78.0–100.0)
Monocytes Absolute: 0.6 10*3/uL (ref 0.1–1.0)
Monocytes Relative: 4 % (ref 3–12)
Neutro Abs: 12.1 10*3/uL — ABNORMAL HIGH (ref 1.7–7.7)
Neutrophils Relative %: 89 % — ABNORMAL HIGH (ref 43–77)
Platelets: 329 10*3/uL (ref 150–400)
RBC: 3.9 MIL/uL (ref 3.87–5.11)
RDW: 12.5 % (ref 11.5–15.5)
WBC: 13.6 10*3/uL — ABNORMAL HIGH (ref 4.0–10.5)

## 2013-04-28 MED ORDER — MORPHINE SULFATE 4 MG/ML IJ SOLN
4.0000 mg | Freq: Once | INTRAMUSCULAR | Status: AC
  Administered 2013-04-28: 4 mg via INTRAVENOUS
  Filled 2013-04-28: qty 1

## 2013-04-28 MED ORDER — LORAZEPAM 2 MG/ML IJ SOLN
0.5000 mg | Freq: Once | INTRAMUSCULAR | Status: AC
  Administered 2013-04-28: 0.5 mg via INTRAVENOUS
  Filled 2013-04-28: qty 1

## 2013-04-28 MED ORDER — TRAMADOL HCL 50 MG PO TABS: 50.0000 mg | ORAL_TABLET | Freq: Four times a day (QID) | ORAL | Status: DC | PRN

## 2013-04-28 MED ORDER — METHOCARBAMOL 500 MG PO TABS: 500.0000 mg | ORAL_TABLET | Freq: Two times a day (BID) | ORAL | Status: DC

## 2013-04-28 NOTE — ED Notes (Signed)
To ED from home via EMS, fell down 2 stairs with no LOC, post-fall had a syncopal episode, initial BP 50/30, HR 60s, BP 120/70 pta, CBG 108, 18g right forearm, c/o pain to right hip, no shortening or rotation, god CMS, A/OX4, NAD

## 2013-04-28 NOTE — ED Notes (Signed)
Pt taken over for xray.

## 2013-04-28 NOTE — ED Provider Notes (Signed)
Care assumed from [place], [place] at shift change. Patient with syncopal episode s/p falling down stairs and injuring back. She is able to ambulate, complains of a pulling sensation per prior provider. Xray lumbar spine pending, UA pending. Plan to discharge home with ultram and robaxin. 5:17 PM X-ray normal, urinalysis normal. Patient states her pain is controlled at this time and she feels well enough to go home. She is stable for discharge. Return precautions discussed. Patient states understanding of plan and is agreeable.  Trevor Mace, PA-C 04/28/13 1717

## 2013-04-28 NOTE — ED Notes (Signed)
Robyn, PA back in with patient

## 2013-04-28 NOTE — ED Notes (Signed)
Pt does not want to ambulate at this time due to onset of nausea during orthostatic vital signs.

## 2013-04-28 NOTE — ED Provider Notes (Signed)
CSN: [id number]     Arrival date & time 04/28/13  1306 History   First MD Initiated Contact with Patient 04/28/13 1318     Chief Complaint  Patient presents with  . Fall   (Consider location/radiation/quality/duration/timing/severity/associated sxs/prior Treatment) HPI  62 year old female with history of thyroid disease, COPD, and arthritis presents to the ED from home via EMS for evaluations of recent fall. Patient reports she was busy this morning with preparing her house for Halloween decoration.  She was carrying some decoration down the steps, slipped on the second stent and fell down hitting her left side of body against the stair case. She did not hit her head or loss of consciousness. Her husband came over to assist her to a chair. Patient states subsequently she felt lightheadedness, blurred vision, and had a apparent syncopal episode. As mentioned she was sitting up eyes for about a minute. No body convulsion, no seizure activity.  No post ictal state.  Pt did report feeling diaphoretic afterward lasting for a few minutes.  EMS were called.  Pt was found to have an initial BP of 50/30 per EMS but subsequently improved to 120/70 with IVF. No documented hypotensive vital sign in ER.  Pt has initial  CBG of 108.  Patient felt fall was mechanical when she slipped and fell. She denies any precipitating symptoms prior to the fall. She is currently denies headache, vision changes, slurring of speech, difficulty thinking, neck pain, chest pain, shortness of breath, abdominal pain, back pain, dysuria, numbness or weakness. She did not eat her breakfast this morning but she normally does not eat breakfast. She denies taking any narcotic pain medication or being on blood thinner medication. There has been no recent medication changes. Denies any recent alcohol or recreational drug use. Patient only complain of mild tenderness to the right waistline but not in the hip. Pain is achy, 2/10, and  nonradiating.  Past Medical History  Diagnosis Date  . Hyperlipidemia   . Thyroid disease   . COPD (chronic obstructive pulmonary disease)   . Arthritis    Past Surgical History  Procedure Laterality Date  . Broken leg    . Nose surgery    . Colonoscopy  02-26-12    per Dr. Russella Dar, clear, repeat in 10 yrs    Family History  Problem Relation Age of Onset  . Arthritis Mother   . COPD Mother   . Hypertension Mother   . Arthritis Brother   . Heart disease Brother   . Cancer Maternal Grandfather     COLON  . Colon cancer Maternal Grandfather 80  . Heart disease Paternal Grandmother   . Cancer Paternal Grandfather     COLON  . Colon cancer Paternal Grandfather 76  . Stomach cancer Maternal Grandmother 60   History  Substance Use Topics  . Smoking status: Current Every Day Smoker -- 0.50 packs/day    Types: Cigarettes  . Smokeless tobacco: Never Used  . Alcohol Use: 0.0 oz/week     Comment: rare   OB History   Grav Para Term Preterm Abortions TAB SAB Ect Mult Living   0              Review of Systems  All other systems reviewed and are negative.    Allergies  Red dye  Home Medications   Current Outpatient Rx  Name  Route  Sig  Dispense  Refill  . Calcium Citrate-Vitamin D (CITRACAL + D PO)   Oral  Take by mouth 2 (two) times daily.           . diclofenac (VOLTAREN) 75 MG EC tablet   Oral   Take 1 tablet (75 mg total) by mouth 2 (two) times daily.   180 tablet   3   . estrogen, conjugated,-medroxyprogesterone (PREMPRO) 0.3-1.5 MG per tablet   Oral   Take 1 tablet by mouth daily.   90 tablet   3   . levothyroxine (SYNTHROID) 100 MCG tablet   Oral   Take 1 tablet (100 mcg total) by mouth daily.   90 tablet   3    BP 118/69  Pulse 61  Temp(Src) 97.9 F (36.6 C) (Oral)  Resp 18  Ht 5\' 6"  (1.676 m)  Wt 143 lb (64.864 kg)  BMI 23.09 kg/m2  SpO2 97% Physical Exam  Nursing note and vitals reviewed. Constitutional: She appears well-developed  and well-nourished. No distress.  Awake, alert, nontoxic appearance  HENT:  Head: Atraumatic.  Eyes: Conjunctivae are normal. Right eye exhibits no discharge. Left eye exhibits no discharge.  Neck: Normal range of motion. Neck supple.  Cardiovascular: Normal rate and regular rhythm.   Pulmonary/Chest: Effort normal. No respiratory distress. She exhibits no tenderness.  Abdominal: Soft. There is no tenderness. There is no rebound.  Musculoskeletal: She exhibits tenderness (Tenderness to posterio-lateral aspect of R hip on palpation.  normal right hip range of motion without any deformity or pain. No significant midline spine tenderness, crepitus or step-off noted).  ROM appears intact, no obvious focal weakness  Neurological: She has normal strength. No cranial nerve deficit or sensory deficit. She displays a negative Romberg sign. Coordination and gait normal. GCS eye subscore is 4. GCS verbal subscore is 5. GCS motor subscore is 6.  Mental status and motor strength appears intact  Skin: No rash noted.  Psychiatric: She has a normal mood and affect.    ED Course  Procedures (including critical care time)   Date: 04/28/2013  Rate: 71  Rhythm: normal sinus rhythm  QRS Axis: normal  Intervals: normal  ST/T Wave abnormalities: nonspecific ST/T changes  Conduction Disutrbances:none  Narrative Interpretation:   Old EKG Reviewed: unchanged    Patient here for evaluations of a syncopal episode after a mechanical fall. Patient is back to her normal baseline. No significant evidence of injury noted on initial exam. Workup initiated including EKG, basic labs, and UA. We'll check orthostatic vital sign.  Pt currently has no headache, she has no focal neuro deficits.  Doubt intracranial pathology or traumatic head injury.  Pt is in the low-risk group for serious outcome based on the [facility] Syncope Rule.  Care discussed with attending.    3:16 PM Patient has a normal orthostatic vital  sign. However he does not want to ambulate because she endorses nausea.  4:00 PM Pt was able to ambulate but report pain to R aspect of hip overlying soft tissue and not overlying any bony prominence.  Likely muscle contusion.  I offer xray, pt declined.  Pt report muscle cramping when sitting down.  Will give muscle relaxant and pain medication.  UA obtained.    4:31 PM Care discussed with oncoming PA who will dispo pt pending result.    Labs Review Labs Reviewed  CBC WITH DIFFERENTIAL - Abnormal; Notable for the following:    WBC 13.6 (*)    HCT 34.5 (*)    Neutrophils Relative % 89 (*)    Neutro Abs 12.1 (*)  Lymphocytes Relative 7 (*)    All other components within normal limits  POCT I-STAT, CHEM 8 - Abnormal; Notable for the following:    Sodium 133 (*)    Glucose, Bld 108 (*)    Calcium, Ion 1.10 (*)    All other components within normal limits  URINALYSIS, ROUTINE W REFLEX MICROSCOPIC   Imaging Review No results found.  MDM   1. Fall at home, initial encounter   2. Low back pain   3. Syncope, non cardiac    BP 113/58  Pulse 78  Temp(Src) 98.3 F (36.8 C) (Oral)  Resp 20  Ht 5\' 6"  (1.676 m)  Wt 143 lb (64.864 kg)  BMI 23.09 kg/m2  SpO2 97%     Fayrene Helper, PA-C 04/28/13 1632

## 2013-04-29 NOTE — ED Provider Notes (Signed)
Medical screening examination/treatment/procedure(s) were performed by non-physician practitioner and as supervising physician I was immediately available for consultation/collaboration.    Wendall Isabell D Selicia Windom, MD 04/29/13 0006 

## 2013-04-29 NOTE — ED Provider Notes (Signed)
Medical screening examination/treatment/procedure(s) were conducted as a shared visit with non-physician practitioner(s) and myself.  I personally evaluated the patient during the encounter.  Patient is a 62 year old female with history of COPD arthritis. She presents today after a fall at home. States she was walking down some steps and slipped and landed with her low back on one of the stairs. She developed pain in her low back and her husband helped her to the couch where she subsequently had some sort of syncopal episode. She was then brought to the ER for evaluation. She denies any radiation of her pain down her legs. She denies any bowel or bladder complaints. She has no history of any serious low back issues.  On exam vitals are stable the patient is afebrile. She is awake alert and appropriate. Heart is regular rate and rhythm without murmurs rubs or gallops. Lungs are clear to auscultation bilaterally without wheezes or rhonchi. The abdomen is soft, nontender. There is tenderness to palpation in the soft tissues of the left lateral lumbar region and upper sacrum. Strength is 5 out of 5 in the bilateral lower extremities and deep tendon reflexes are 1+ patellar bilaterally and 1+ Achilles bilaterally. She is able to ambulate with some discomfort, however no deficits.  Patient presents here after a fall complaining of pain in her left lower lumbar region. She had some sort of syncopal episode prior to coming here that I believe is likely vasovagal and related to pain. Her EKG is unremarkable and nothing in her laboratory studies is significantly abnormal. X-rays of the lumbar spine are in process and will be obtained. At this point care will be signed out to the oncoming mid-level provider. The disposition will depend on the results of these studies. If there is no fracture or bleed the patient is stable for discharge. If the x-ray shows some sort of unexpected emergent pathology, consultations will be  made.  Geoffery Lyons, MD 04/29/13 [phone]

## 2013-05-02 ENCOUNTER — Telehealth: Payer: Self-pay | Admitting: Family Medicine

## 2013-05-02 MED ORDER — TRAMADOL HCL 50 MG PO TABS: 50.0000 mg | ORAL_TABLET | Freq: Four times a day (QID) | ORAL | Status: DC | PRN

## 2013-05-02 NOTE — Telephone Encounter (Signed)
Please make sure to call in to local pharmacy ([facility])

## 2013-05-02 NOTE — Telephone Encounter (Signed)
Pt fell Monday down some steps and hurt her back. Pt had to go to ED. Pt cannot come in today due to the pain and not being able to hardly move. Pt has post hos fup on wed at 1pm. Would like to know if Dr fry will send her some pain meds to get her through to her appt. on Wed.  pls advise.

## 2013-05-02 NOTE — Telephone Encounter (Signed)
Call in Tramadol 50 mg to take 1 or 2 q 6 hours prn pain, # 60 with no rf

## 2013-05-02 NOTE — Telephone Encounter (Signed)
I called in script and left message for pt.

## 2013-05-07 ENCOUNTER — Encounter: Payer: Self-pay | Admitting: Family Medicine

## 2013-05-07 ENCOUNTER — Ambulatory Visit (INDEPENDENT_AMBULATORY_CARE_PROVIDER_SITE_OTHER): Payer: Managed Care, Other (non HMO) | Admitting: Family Medicine

## 2013-05-07 VITALS — BP 120/68 | HR 90 | Temp 98.2°F | Wt 148.0 lb

## 2013-05-07 DIAGNOSIS — Z23 Encounter for immunization: Secondary | ICD-10-CM

## 2013-05-07 DIAGNOSIS — S20221D Contusion of right back wall of thorax, subsequent encounter: Secondary | ICD-10-CM

## 2013-05-07 DIAGNOSIS — Z5189 Encounter for other specified aftercare: Secondary | ICD-10-CM

## 2013-05-07 NOTE — Progress Notes (Signed)
  Subjective:    Patient ID: Stacy Gillespie, female    DOB: 16-Jun-1951, 62 y.o.   MRN: [id number]  HPI Here to follow up an ER visit on 04-28-13 after she slipped and fell on her stairs at home. She had a deep bruise on the lower back but Xrays were negative for fractures. She had also fainted briefly at home shortly after she fell, but she has had no neurologic issues since then. She had been stiff and in a lot of pain for a few days, but Tramadol helped. Now she is feeling much better than before.    Review of Systems  Constitutional: Negative.   Musculoskeletal: Positive for back pain.       Objective:   Physical Exam  Constitutional: She appears well-developed and well-nourished. No distress.  Musculoskeletal:  Tender in the right lower back, no swelling or bruising seen. Full ROM           Assessment & Plan:  Recheck prn.

## 2013-06-03 ENCOUNTER — Other Ambulatory Visit: Payer: Self-pay

## 2013-06-03 DIAGNOSIS — Z1231 Encounter for screening mammogram for malignant neoplasm of breast: Secondary | ICD-10-CM

## 2013-06-25 ENCOUNTER — Encounter: Payer: Managed Care, Other (non HMO) | Admitting: Gynecology

## 2013-07-07 ENCOUNTER — Ambulatory Visit
Admission: RE | Admit: 2013-07-07 | Discharge: 2013-07-07 | Disposition: A | Discharge status: Home / Self Care | Payer: Managed Care, Other (non HMO) | Source: Ambulatory Visit

## 2013-07-07 DIAGNOSIS — Z1231 Encounter for screening mammogram for malignant neoplasm of breast: Secondary | ICD-10-CM

## 2013-09-04 ENCOUNTER — Encounter: Payer: Self-pay | Admitting: *Deleted

## 2013-09-05 ENCOUNTER — Encounter: Payer: Self-pay | Admitting: Family Medicine

## 2013-09-05 ENCOUNTER — Ambulatory Visit (INDEPENDENT_AMBULATORY_CARE_PROVIDER_SITE_OTHER): Payer: Managed Care, Other (non HMO) | Admitting: Family Medicine

## 2013-09-05 VITALS — BP 120/70 | HR 82 | Temp 98.4°F | Ht 66.0 in | Wt 132.0 lb

## 2013-09-05 DIAGNOSIS — H8110 Benign paroxysmal vertigo, unspecified ear: Secondary | ICD-10-CM

## 2013-09-05 NOTE — Progress Notes (Signed)
Pre visit review using our clinic review tool, if applicable. No additional management support is needed unless otherwise documented below in the visit note. 

## 2013-09-05 NOTE — Progress Notes (Signed)
   Subjective:    Patient ID: Stacy Gillespie, female    DOB: 09-Jan-1951, 63 y.o.   MRN: [id number]  HPI Here for another bout of vertigo. This started 2 weeks ago. It only bothers her when she lies down in bed or when she gets up out of bed. She is fine during the day. She admits to some sinus pressure and ear pressure lately. No fever. She uses Nasacort daily.   Review of Systems  Constitutional: Negative.   HENT: Positive for congestion and sinus pressure. Negative for ear discharge, ear pain, postnasal drip and rhinorrhea.   Eyes: Negative.   Neurological: Positive for dizziness. Negative for tremors, seizures, syncope, facial asymmetry, speech difficulty, weakness, light-headedness, numbness and headaches.       Objective:   Physical Exam  Constitutional: She is oriented to person, place, and time. She appears well-developed and well-nourished.  HENT:  Right Ear: External ear normal.  Left Ear: External ear normal.  Nose: Nose normal.  Mouth/Throat: Oropharynx is clear and moist.  Eyes: Conjunctivae and EOM are normal. Pupils are equal, round, and reactive to light.  Neck: No thyromegaly present.  Lymphadenopathy:    She has no cervical adenopathy.  Neurological: She is alert and oriented to person, place, and time. No cranial nerve deficit.          Assessment & Plan:  Add Claritin D daily. Recheck prn

## 2013-09-08 ENCOUNTER — Telehealth: Payer: Self-pay | Admitting: Family Medicine

## 2013-09-08 NOTE — Telephone Encounter (Signed)
Relevant patient education mailed to patient.  

## 2013-09-24 ENCOUNTER — Other Ambulatory Visit (HOSPITAL_COMMUNITY)
Admission: RE | Admit: 2013-09-24 | Discharge: 2013-09-24 | Disposition: A | Discharge status: Home / Self Care | Payer: Managed Care, Other (non HMO) | Source: Ambulatory Visit | Attending: Gynecology | Admitting: Gynecology

## 2013-09-24 ENCOUNTER — Ambulatory Visit (INDEPENDENT_AMBULATORY_CARE_PROVIDER_SITE_OTHER): Payer: Managed Care, Other (non HMO) | Admitting: Gynecology

## 2013-09-24 ENCOUNTER — Encounter: Payer: Self-pay | Admitting: Gynecology

## 2013-09-24 VITALS — BP 120/66 | Ht 67.0 in | Wt 133.0 lb

## 2013-09-24 DIAGNOSIS — Z79890 Hormone replacement therapy (postmenopausal): Secondary | ICD-10-CM

## 2013-09-24 DIAGNOSIS — Z1151 Encounter for screening for human papillomavirus (HPV): Secondary | ICD-10-CM | POA: Insufficient documentation

## 2013-09-24 DIAGNOSIS — Z01419 Encounter for gynecological examination (general) (routine) without abnormal findings: Secondary | ICD-10-CM

## 2013-09-24 DIAGNOSIS — Z780 Asymptomatic menopausal state: Secondary | ICD-10-CM

## 2013-09-24 MED ORDER — CONJ ESTROG-MEDROXYPROGEST ACE 0.3-1.5 MG PO TABS
1.0000 | ORAL_TABLET | Freq: Every day | ORAL | Status: DC
Start: 2013-09-24 — End: 2014-01-27

## 2013-09-24 MED ORDER — ESTROGENS, CONJUGATED 0.625 MG/GM VA CREA: 1.0000 | TOPICAL_CREAM | VAGINAL | Status: DC

## 2013-09-24 NOTE — Progress Notes (Signed)
Stacy Gillespie September 15, 1950 [id number]        63 y.o.  G0P0 for annual exam.  Former patient of Dr. Cherylann Banas. Several issues noted below.  Past medical history,surgical history, problem list, medications, allergies, family history and social history were all reviewed and documented in the EPIC chart.  ROS:  Performed and pertinent positives and negatives are included in the history, assessment and plan .  Exam: Kim assistant Filed Vitals:   09/24/13 1355  BP: 120/66  Height: 5\' 7"  (1.702 m)  Weight: 133 lb (60.328 kg)   General appearance  Normal Skin grossly normal Head/Neck normal with no cervical or supraclavicular adenopathy thyroid normal Lungs  clear Cardiac RR, without RMG Abdominal  soft, nontender, without masses, organomegaly or hernia Breasts  examined lying and sitting without masses, retractions, discharge or axillary adenopathy. Pelvic  Ext/BUS/vagina with generalized atrophic changes  Cervix atrophic in appearance. Pap/HPV  Uterus anteverted, normal size, shape and contour, midline and mobile nontender   Adnexa  Without masses or tenderness    Anus and perineum  Normal   Rectovaginal  Normal sphincter tone without palpated masses or tenderness.    Assessment/Plan:  63 y.o. G0P0 female for annual exam.   1. Postmenopausal/menopausal symptoms/atrophic vaginitis/HRT. Patient continues on Prempro 0.3/1.5. Has tried to wean and had unacceptable hot flushes night sweats and muscle aches. Also uses Premarin vaginal cream twice weekly for vaginal dryness.  I reviewed the whole issue of HRT with her to include the WHI study with increased risk of stroke, heart attack, DVT and breast cancer. The ACOG and NAMS statements for lowest dose for the shortest period of time reviewed. Transdermal versus oral first-pass effect benefit discussed. After lengthy discussion the patient wants to continue and accepts the risks. Possible increased risk of thrombosis associated with aging and  cigarette smoke also discussed. Refill Prempro 0.3/1.5 and Premarin vaginal cream x1 year. 2. Pap smear 2012. Pap/HPV today. No history of significant abnormal Pap smears. Plan repeat Pap smear at 3-5 year interval per current screening guidelines. 3. Mammography 06/2013. Continue with annual mammography. SBE monthly reviewed. 4. DEXA 2011. Repeat DEXA now. Increase calcium vitamin D reviewed. 5. Colonoscopy 2013. Repeat at their recommended interval. 6. Health maintenance. No blood work done as she has this done through her primary physician's office. Followup for DEXA, otherwise one year, sooner as needed.   Note: This document was prepared with digital dictation and possible smart phrase technology. Any transcriptional errors that result from this process are unintentional.   Anastasio Auerbach MD, 2:46 PM 09/24/2013

## 2013-09-24 NOTE — Patient Instructions (Signed)
Followup in one year for annual exam.  You may obtain a copy of any labs that were done today by logging onto MyChart as outlined in the instructions provided with your AVS (after visit summary). The office will not call with normal lab results but certainly if there are any significant abnormalities then we will contact you.   Health Maintenance, Female A healthy lifestyle and preventative care can promote health and wellness.  Maintain regular health, dental, and eye exams.  Eat a healthy diet. Foods like vegetables, fruits, whole grains, low-fat dairy products, and lean protein foods contain the nutrients you need without too many calories. Decrease your intake of foods high in solid fats, added sugars, and salt. Get information about a proper diet from your caregiver, if necessary.  Regular physical exercise is one of the most important things you can do for your health. Most adults should get at least 150 minutes of moderate-intensity exercise (any activity that increases your heart rate and causes you to sweat) each week. In addition, most adults need muscle-strengthening exercises on 2 or more days a week.   Maintain a healthy weight. The body mass index (BMI) is a screening tool to identify possible weight problems. It provides an estimate of body fat based on height and weight. Your caregiver can help determine your BMI, and can help you achieve or maintain a healthy weight. For adults 20 years and older:  A BMI below 18.5 is considered underweight.  A BMI of 18.5 to 24.9 is normal.  A BMI of 25 to 29.9 is considered overweight.  A BMI of 30 and above is considered obese.  Maintain normal blood lipids and cholesterol by exercising and minimizing your intake of saturated fat. Eat a balanced diet with plenty of fruits and vegetables. Blood tests for lipids and cholesterol should begin at age 78 and be repeated every 5 years. If your lipid or cholesterol levels are high, you are over  50, or you are a high risk for heart disease, you may need your cholesterol levels checked more frequently.Ongoing high lipid and cholesterol levels should be treated with medicines if diet and exercise are not effective.  If you smoke, find out from your caregiver how to quit. If you do not use tobacco, do not start.  Lung cancer screening is recommended for adults aged 15 80 years who are at high risk for developing lung cancer because of a history of smoking. Yearly low-dose computed tomography (CT) is recommended for people who have at least a 30-pack-year history of smoking and are a current smoker or have quit within the past 15 years. A pack year of smoking is smoking an average of 1 pack of cigarettes a day for 1 year (for example: 1 pack a day for 30 years or 2 packs a day for 15 years). Yearly screening should continue until the smoker has stopped smoking for at least 15 years. Yearly screening should also be stopped for people who develop a health problem that would prevent them from having lung cancer treatment.  If you are pregnant, do not drink alcohol. If you are breastfeeding, be very cautious about drinking alcohol. If you are not pregnant and choose to drink alcohol, do not exceed 1 drink per day. One drink is considered to be 12 ounces (355 mL) of beer, 5 ounces (148 mL) of wine, or 1.5 ounces (44 mL) of liquor.  Avoid use of street drugs. Do not share needles with anyone. Ask for help  if you need support or instructions about stopping the use of drugs.  High blood pressure causes heart disease and increases the risk of stroke. Blood pressure should be checked at least every 1 to 2 years. Ongoing high blood pressure should be treated with medicines, if weight loss and exercise are not effective.  If you are 55 to 65 years old, ask your caregiver if you should take aspirin to prevent strokes.  Diabetes screening involves taking a blood sample to check your fasting blood sugar level.  This should be done once every 3 years, after age 45, if you are within normal weight and without risk factors for diabetes. Testing should be considered at a younger age or be carried out more frequently if you are overweight and have at least 1 risk factor for diabetes.  Breast cancer screening is essential preventative care for women. You should practice "breast self-awareness." This means understanding the normal appearance and feel of your breasts and may include breast self-examination. Any changes detected, no matter how small, should be reported to a caregiver. Women in their 20s and 30s should have a clinical breast exam (CBE) by a caregiver as part of a regular health exam every 1 to 3 years. After age 40, women should have a CBE every year. Starting at age 40, women should consider having a mammogram (breast X-ray) every year. Women who have a family history of breast cancer should talk to their caregiver about genetic screening. Women at a high risk of breast cancer should talk to their caregiver about having an MRI and a mammogram every year.  Breast cancer gene (BRCA)-related cancer risk assessment is recommended for women who have family members with BRCA-related cancers. BRCA-related cancers include breast, ovarian, tubal, and peritoneal cancers. Having family members with these cancers may be associated with an increased risk for harmful changes (mutations) in the breast cancer genes BRCA1 and BRCA2. Results of the assessment will determine the need for genetic counseling and BRCA1 and BRCA2 testing.  The Pap test is a screening test for cervical cancer. Women should have a Pap test starting at age 21. Between ages 21 and 29, Pap tests should be repeated every 2 years. Beginning at age 30, you should have a Pap test every 3 years as long as the past 3 Pap tests have been normal. If you had a hysterectomy for a problem that was not cancer or a condition that could lead to cancer, then you no  longer need Pap tests. If you are between ages 65 and 70, and you have had normal Pap tests going back 10 years, you no longer need Pap tests. If you have had past treatment for cervical cancer or a condition that could lead to cancer, you need Pap tests and screening for cancer for at least 20 years after your treatment. If Pap tests have been discontinued, risk factors (such as a new sexual partner) need to be reassessed to determine if screening should be resumed. Some women have medical problems that increase the chance of getting cervical cancer. In these cases, your caregiver may recommend more frequent screening and Pap tests.  The human papillomavirus (HPV) test is an additional test that may be used for cervical cancer screening. The HPV test looks for the virus that can cause the cell changes on the cervix. The cells collected during the Pap test can be tested for HPV. The HPV test could be used to screen women aged 30 years and older, and   should be used in women of any age who have unclear Pap test results. After the age of 30, women should have HPV testing at the same frequency as a Pap test.  Colorectal cancer can be detected and often prevented. Most routine colorectal cancer screening begins at the age of 50 and continues through age 75. However, your caregiver may recommend screening at an earlier age if you have risk factors for colon cancer. On a yearly basis, your caregiver may provide home test kits to check for hidden blood in the stool. Use of a small camera at the end of a tube, to directly examine the colon (sigmoidoscopy or colonoscopy), can detect the earliest forms of colorectal cancer. Talk to your caregiver about this at age 50, when routine screening begins. Direct examination of the colon should be repeated every 5 to 10 years through age 75, unless early forms of pre-cancerous polyps or small growths are found.  Hepatitis C blood testing is recommended for all people born from  1945 through 1965 and any individual with known risks for hepatitis C.  Practice safe sex. Use condoms and avoid high-risk sexual practices to reduce the spread of sexually transmitted infections (STIs). Sexually active women aged 25 and younger should be checked for Chlamydia, which is a common sexually transmitted infection. Older women with new or multiple partners should also be tested for Chlamydia. Testing for other STIs is recommended if you are sexually active and at increased risk.  Osteoporosis is a disease in which the bones lose minerals and strength with aging. This can result in serious bone fractures. The risk of osteoporosis can be identified using a bone density scan. Women ages 65 and over and women at risk for fractures or osteoporosis should discuss screening with their caregivers. Ask your caregiver whether you should be taking a calcium supplement or vitamin D to reduce the rate of osteoporosis.  Menopause can be associated with physical symptoms and risks. Hormone replacement therapy is available to decrease symptoms and risks. You should talk to your caregiver about whether hormone replacement therapy is right for you.  Use sunscreen. Apply sunscreen liberally and repeatedly throughout the day. You should seek shade when your shadow is shorter than you. Protect yourself by wearing long sleeves, pants, a wide-brimmed hat, and sunglasses year round, whenever you are outdoors.  Notify your caregiver of new moles or changes in moles, especially if there is a change in shape or color. Also notify your caregiver if a mole is larger than the size of a pencil eraser.  Stay current with your immunizations. Document Released: 01/23/2011 Document Revised: 11/04/2012 Document Reviewed: 01/23/2011 [employer] Patient Information 2014 [employer].   

## 2013-09-25 LAB — URINALYSIS W MICROSCOPIC + REFLEX CULTURE
Bacteria, UA: NONE SEEN
Bilirubin Urine: NEGATIVE
Casts: NONE SEEN
Crystals: NONE SEEN
Glucose, UA: NEGATIVE mg/dL
Ketones, ur: NEGATIVE mg/dL
Leukocytes, UA: NEGATIVE
Nitrite: NEGATIVE
Protein, ur: NEGATIVE mg/dL
Specific Gravity, Urine: 1.013 (ref 1.005–1.030)
Urobilinogen, UA: 0.2 mg/dL (ref 0.0–1.0)
pH: 5.5 (ref 5.0–8.0)

## 2013-09-29 ENCOUNTER — Encounter: Payer: Self-pay | Admitting: Obstetrics and Gynecology

## 2013-11-24 ENCOUNTER — Ambulatory Visit (INDEPENDENT_AMBULATORY_CARE_PROVIDER_SITE_OTHER): Payer: Managed Care, Other (non HMO)

## 2013-11-24 DIAGNOSIS — M8580 Other specified disorders of bone density and structure, unspecified site: Secondary | ICD-10-CM

## 2013-11-24 DIAGNOSIS — M899 Disorder of bone, unspecified: Secondary | ICD-10-CM

## 2013-11-24 DIAGNOSIS — Z780 Asymptomatic menopausal state: Secondary | ICD-10-CM

## 2013-11-24 DIAGNOSIS — M949 Disorder of cartilage, unspecified: Secondary | ICD-10-CM

## 2013-11-24 DIAGNOSIS — Z13820 Encounter for screening for osteoporosis: Secondary | ICD-10-CM

## 2013-11-24 HISTORY — DX: Other specified disorders of bone density and structure, unspecified site: M85.80

## 2013-11-25 ENCOUNTER — Telehealth: Payer: Self-pay | Admitting: Gynecology

## 2013-11-25 ENCOUNTER — Other Ambulatory Visit: Payer: Self-pay | Admitting: Gynecology

## 2013-11-25 ENCOUNTER — Encounter: Payer: Self-pay | Admitting: Gynecology

## 2013-11-25 DIAGNOSIS — M8580 Other specified disorders of bone density and structure, unspecified site: Secondary | ICD-10-CM

## 2013-11-25 DIAGNOSIS — Z13820 Encounter for screening for osteoporosis: Secondary | ICD-10-CM

## 2013-11-25 DIAGNOSIS — Z780 Asymptomatic menopausal state: Secondary | ICD-10-CM

## 2013-11-25 NOTE — Telephone Encounter (Signed)
Tell patient her bone density did show some mild osteopenia. I would recommend having a vitamin D level checked either here or at her primary physician's office. Also increased weightbearing exercise such as walking on a regular basis will help with this. Plan to repeat her bone density in 2 years to assess for stability.

## 2013-11-25 NOTE — Telephone Encounter (Signed)
Pt informed with the below note, pt has annual in June with PCP will have level drawn then and fax to [place].

## 2013-12-30 ENCOUNTER — Other Ambulatory Visit (INDEPENDENT_AMBULATORY_CARE_PROVIDER_SITE_OTHER): Payer: Medicare Other

## 2013-12-30 DIAGNOSIS — Z79899 Other long term (current) drug therapy: Secondary | ICD-10-CM

## 2013-12-30 DIAGNOSIS — Z0000 Encounter for general adult medical examination without abnormal findings: Secondary | ICD-10-CM | POA: Diagnosis not present

## 2013-12-30 DIAGNOSIS — E785 Hyperlipidemia, unspecified: Secondary | ICD-10-CM | POA: Diagnosis not present

## 2013-12-30 LAB — CBC WITH DIFFERENTIAL/PLATELET
Basophils Absolute: 0 10*3/uL (ref 0.0–0.1)
Basophils Relative: 0.4 % (ref 0.0–3.0)
Eosinophils Absolute: 0.1 10*3/uL (ref 0.0–0.7)
Eosinophils Relative: 1.2 % (ref 0.0–5.0)
HCT: 41.1 % (ref 36.0–46.0)
Hemoglobin: 13.8 g/dL (ref 12.0–15.0)
Lymphocytes Relative: 22.9 % (ref 12.0–46.0)
Lymphs Abs: 2.1 10*3/uL (ref 0.7–4.0)
MCHC: 33.6 g/dL (ref 30.0–36.0)
MCV: 92.4 fl (ref 78.0–100.0)
Monocytes Absolute: 0.6 10*3/uL (ref 0.1–1.0)
Monocytes Relative: 6.9 % (ref 3.0–12.0)
Neutro Abs: 6.2 10*3/uL (ref 1.4–7.7)
Neutrophils Relative %: 68.6 % (ref 43.0–77.0)
Platelets: 356 10*3/uL (ref 150.0–400.0)
RBC: 4.45 Mil/uL (ref 3.87–5.11)
RDW: 12.4 % (ref 11.5–15.5)
WBC: 9.1 10*3/uL (ref 4.0–10.5)

## 2013-12-30 LAB — VITAMIN D 25 HYDROXY (VIT D DEFICIENCY, FRACTURES): VITD: 29.54 ng/mL

## 2013-12-30 LAB — LIPID PANEL
Cholesterol: 184 mg/dL (ref 0–200)
HDL: 75.1 mg/dL (ref >39.00)
LDL Cholesterol: 97 mg/dL (ref 0–99)
NonHDL: 108.9
Total CHOL/HDL Ratio: 2
Triglycerides: 62 mg/dL (ref 0.0–149.0)
VLDL: 12.4 mg/dL (ref 0.0–40.0)

## 2013-12-30 LAB — BASIC METABOLIC PANEL
BUN: 16 mg/dL (ref 6–23)
CO2: 29 mEq/L (ref 19–32)
Calcium: 9.9 mg/dL (ref 8.4–10.5)
Chloride: 103 mEq/L (ref 96–112)
Creatinine, Ser: 0.9 mg/dL (ref 0.4–1.2)
GFR: 63.52 mL/min (ref >60.00)
Glucose, Bld: 93 mg/dL (ref 70–99)
Potassium: 5.1 mEq/L (ref 3.5–5.1)
Sodium: 139 mEq/L (ref 135–145)

## 2013-12-30 LAB — HEPATIC FUNCTION PANEL
ALT: 13 U/L (ref 0–35)
AST: 17 U/L (ref 0–37)
Albumin: 4 g/dL (ref 3.5–5.2)
Alkaline Phosphatase: 89 U/L (ref 39–117)
Bilirubin, Direct: 0.1 mg/dL (ref 0.0–0.3)
Total Bilirubin: 0.5 mg/dL (ref 0.2–1.2)
Total Protein: 6.9 g/dL (ref 6.0–8.3)

## 2013-12-30 LAB — TSH: TSH: 1.62 u[IU]/mL (ref 0.35–4.50)

## 2013-12-30 NOTE — Addendum Note (Signed)
Addended by: Elmer Picker on: 12/30/2013 09:37 AM   Modules accepted: Orders

## 2014-01-05 ENCOUNTER — Ambulatory Visit (INDEPENDENT_AMBULATORY_CARE_PROVIDER_SITE_OTHER): Payer: Medicare Other | Admitting: Family Medicine

## 2014-01-05 ENCOUNTER — Encounter: Payer: Self-pay | Admitting: Family Medicine

## 2014-01-05 VITALS — BP 110/60 | HR 92 | Temp 99.4°F | Ht 66.0 in | Wt 131.5 lb

## 2014-01-05 DIAGNOSIS — Z0000 Encounter for general adult medical examination without abnormal findings: Secondary | ICD-10-CM | POA: Diagnosis not present

## 2014-01-05 MED ORDER — ROSUVASTATIN CALCIUM 10 MG PO TABS: 10.0000 mg | ORAL_TABLET | Freq: Every day | ORAL | Status: DC

## 2014-01-05 MED ORDER — DICLOFENAC SODIUM 75 MG PO TBEC
75.0000 mg | DELAYED_RELEASE_TABLET | Freq: Every day | ORAL | Status: DC
Start: 2014-01-05 — End: 2014-01-29

## 2014-01-05 MED ORDER — LEVOTHYROXINE SODIUM 100 MCG PO TABS
100.0000 ug | ORAL_TABLET | Freq: Every day | ORAL | Status: DC
Start: 2014-01-05 — End: 2014-01-29

## 2014-01-05 MED ORDER — TRIAMCINOLONE ACETONIDE 55 MCG/ACT NA AERO: 2.0000 | INHALATION_SPRAY | Freq: Every day | NASAL | Status: DC

## 2014-01-05 NOTE — Progress Notes (Signed)
   Subjective:    Patient ID: Stacy Gillespie, female    DOB: Mar 10, 1951, 63 y.o.   MRN: [id number]  HPI 63 yr old female for a cpx. She is doing well. She has some pain and some clicking in the right thumb but Voltaren helps.    Review of Systems  Constitutional: Negative.   HENT: Negative.   Eyes: Negative.   Respiratory: Negative.   Cardiovascular: Negative.   Gastrointestinal: Negative.   Genitourinary: Negative for dysuria, urgency, frequency, hematuria, flank pain, decreased urine volume, enuresis, difficulty urinating, pelvic pain and dyspareunia.  Musculoskeletal: Negative.   Skin: Negative.   Neurological: Negative.   Psychiatric/Behavioral: Negative.        Objective:   Physical Exam  Constitutional: She is oriented to person, place, and time. She appears well-developed and well-nourished. No distress.  HENT:  Head: Normocephalic and atraumatic.  Right Ear: External ear normal.  Left Ear: External ear normal.  Nose: Nose normal.  Mouth/Throat: Oropharynx is clear and moist. No oropharyngeal exudate.  Eyes: Conjunctivae and EOM are normal. Pupils are equal, round, and reactive to light. No scleral icterus.  Neck: Normal range of motion. Neck supple. No JVD present. No thyromegaly present.  Cardiovascular: Normal rate, regular rhythm, normal heart sounds and intact distal pulses.  Exam reveals no gallop and no friction rub.   No murmur heard. EKG normal   Pulmonary/Chest: Effort normal and breath sounds normal. No respiratory distress. She has no wheezes. She has no rales. She exhibits no tenderness.  Abdominal: Soft. Bowel sounds are normal. She exhibits no distension and no mass. There is no tenderness. There is no rebound and no guarding.  Musculoskeletal: Normal range of motion. She exhibits no edema and no tenderness.  Right thumb has some crepitus at the MCP joint  Lymphadenopathy:    She has no cervical adenopathy.  Neurological: She is alert and oriented to  person, place, and time. She has normal reflexes. No cranial nerve deficit. She exhibits normal muscle tone. Coordination normal.  Skin: Skin is warm and dry. No rash noted. No erythema.  Psychiatric: She has a normal mood and affect. Her behavior is normal. Judgment and thought content normal.          Assessment & Plan:  Well exam.

## 2014-01-05 NOTE — Progress Notes (Signed)
Pre visit review using our clinic review tool, if applicable. No additional management support is needed unless otherwise documented below in the visit note. 

## 2014-01-06 ENCOUNTER — Telehealth: Payer: Self-pay | Admitting: Family Medicine

## 2014-01-06 NOTE — Telephone Encounter (Signed)
Relevant patient education mailed to patient.  

## 2014-01-27 ENCOUNTER — Telehealth: Payer: Self-pay | Admitting: Family Medicine

## 2014-01-27 ENCOUNTER — Other Ambulatory Visit: Payer: Self-pay

## 2014-01-27 MED ORDER — CONJ ESTROG-MEDROXYPROGEST ACE 0.3-1.5 MG PO TABS
1.0000 | ORAL_TABLET | Freq: Every day | ORAL | Status: DC
Start: 2014-01-27 — End: 2015-04-23

## 2014-01-27 NOTE — Telephone Encounter (Signed)
Please add levothyroxine (SYNTHROID) 100 MCG tablet  To the re-fill request

## 2014-01-27 NOTE — Telephone Encounter (Signed)
[facility] is requesting re-fills on the following: rosuvastatin (CRESTOR) 10 MG tablet levothyroxine (SYNTHROID) 100 MCG tablet triamcinolone (NASACORT AQ) 55 MCG/ACT AERO nasal inhaler

## 2014-01-29 MED ORDER — TRIAMCINOLONE ACETONIDE 55 MCG/ACT NA AERO: 2.0000 | INHALATION_SPRAY | Freq: Every day | NASAL | Status: DC

## 2014-01-29 MED ORDER — LEVOTHYROXINE SODIUM 100 MCG PO TABS: 100.0000 ug | ORAL_TABLET | Freq: Every day | ORAL | Status: DC

## 2014-01-29 MED ORDER — ROSUVASTATIN CALCIUM 10 MG PO TABS: 10.0000 mg | ORAL_TABLET | Freq: Every day | ORAL | Status: DC

## 2014-01-29 MED ORDER — DICLOFENAC SODIUM 75 MG PO TBEC: 75.0000 mg | DELAYED_RELEASE_TABLET | Freq: Every day | ORAL | Status: DC

## 2014-01-29 NOTE — Telephone Encounter (Signed)
All prescriptions requested sent to [facility].

## 2014-01-29 NOTE — Telephone Encounter (Signed)
Please add diclofenac (VOLTAREN) 75 MG EC tablet to the re-fill request list

## 2014-05-06 ENCOUNTER — Ambulatory Visit (INDEPENDENT_AMBULATORY_CARE_PROVIDER_SITE_OTHER): Payer: Medicare Other

## 2014-05-06 DIAGNOSIS — Z23 Encounter for immunization: Secondary | ICD-10-CM

## 2014-05-11 DIAGNOSIS — M25562 Pain in left knee: Secondary | ICD-10-CM | POA: Diagnosis not present

## 2014-05-11 DIAGNOSIS — M25552 Pain in left hip: Secondary | ICD-10-CM | POA: Diagnosis not present

## 2014-06-01 DIAGNOSIS — M7062 Trochanteric bursitis, left hip: Secondary | ICD-10-CM | POA: Diagnosis not present

## 2014-07-03 ENCOUNTER — Other Ambulatory Visit: Payer: Self-pay

## 2014-07-03 DIAGNOSIS — Z1231 Encounter for screening mammogram for malignant neoplasm of breast: Secondary | ICD-10-CM

## 2014-07-21 ENCOUNTER — Ambulatory Visit
Admission: RE | Admit: 2014-07-21 | Discharge: 2014-07-21 | Disposition: A | Discharge status: Home / Self Care | Payer: Medicare Other | Source: Ambulatory Visit

## 2014-07-21 DIAGNOSIS — Z1231 Encounter for screening mammogram for malignant neoplasm of breast: Secondary | ICD-10-CM

## 2015-01-07 ENCOUNTER — Encounter: Payer: Self-pay | Admitting: Family Medicine

## 2015-01-07 ENCOUNTER — Ambulatory Visit (INDEPENDENT_AMBULATORY_CARE_PROVIDER_SITE_OTHER): Payer: Medicare Other | Admitting: Family Medicine

## 2015-01-07 VITALS — BP 107/63 | HR 65 | Temp 98.6°F | Ht 66.0 in | Wt 140.0 lb

## 2015-01-07 DIAGNOSIS — M129 Arthropathy, unspecified: Secondary | ICD-10-CM

## 2015-01-07 DIAGNOSIS — E038 Other specified hypothyroidism: Secondary | ICD-10-CM

## 2015-01-07 DIAGNOSIS — R05 Cough: Secondary | ICD-10-CM | POA: Diagnosis not present

## 2015-01-07 DIAGNOSIS — E785 Hyperlipidemia, unspecified: Secondary | ICD-10-CM | POA: Diagnosis not present

## 2015-01-07 DIAGNOSIS — R053 Chronic cough: Secondary | ICD-10-CM

## 2015-01-07 LAB — CBC WITH DIFFERENTIAL/PLATELET
Basophils Absolute: 0 10*3/uL (ref 0.0–0.1)
Basophils Relative: 0.4 % (ref 0.0–3.0)
Eosinophils Absolute: 0.1 10*3/uL (ref 0.0–0.7)
Eosinophils Relative: 0.6 % (ref 0.0–5.0)
HCT: 40.8 % (ref 36.0–46.0)
Hemoglobin: 13.7 g/dL (ref 12.0–15.0)
Lymphocytes Relative: 18.7 % (ref 12.0–46.0)
Lymphs Abs: 1.9 10*3/uL (ref 0.7–4.0)
MCHC: 33.6 g/dL (ref 30.0–36.0)
MCV: 90.6 fl (ref 78.0–100.0)
Monocytes Absolute: 0.7 10*3/uL (ref 0.1–1.0)
Monocytes Relative: 6.6 % (ref 3.0–12.0)
Neutro Abs: 7.5 10*3/uL (ref 1.4–7.7)
Neutrophils Relative %: 73.7 % (ref 43.0–77.0)
Platelets: 384 10*3/uL (ref 150.0–400.0)
RBC: 4.5 Mil/uL (ref 3.87–5.11)
RDW: 12.9 % (ref 11.5–15.5)
WBC: 10.2 10*3/uL (ref 4.0–10.5)

## 2015-01-07 LAB — BASIC METABOLIC PANEL
BUN: 13 mg/dL (ref 6–23)
CO2: 29 mEq/L (ref 19–32)
Calcium: 9.9 mg/dL (ref 8.4–10.5)
Chloride: 99 mEq/L (ref 96–112)
Creatinine, Ser: 0.84 mg/dL (ref 0.40–1.20)
GFR: 72.1 mL/min (ref >60.00)
Glucose, Bld: 88 mg/dL (ref 70–99)
Potassium: 5 mEq/L (ref 3.5–5.1)
Sodium: 134 mEq/L — ABNORMAL LOW (ref 135–145)

## 2015-01-07 LAB — HEPATIC FUNCTION PANEL
ALT: 9 U/L (ref 0–35)
AST: 17 U/L (ref 0–37)
Albumin: 4.2 g/dL (ref 3.5–5.2)
Alkaline Phosphatase: 103 U/L (ref 39–117)
Bilirubin, Direct: 0 mg/dL (ref 0.0–0.3)
Total Bilirubin: 0.4 mg/dL (ref 0.2–1.2)
Total Protein: 7.2 g/dL (ref 6.0–8.3)

## 2015-01-07 LAB — POCT URINALYSIS DIPSTICK
Bilirubin, UA: NEGATIVE
Glucose, UA: NEGATIVE
Ketones, UA: NEGATIVE
Leukocytes, UA: NEGATIVE
Nitrite, UA: NEGATIVE
Protein, UA: NEGATIVE
Spec Grav, UA: 1.015
Urobilinogen, UA: 0.2
pH, UA: 7.5

## 2015-01-07 LAB — LIPID PANEL
Cholesterol: 229 mg/dL — ABNORMAL HIGH (ref 0–200)
HDL: 77.8 mg/dL (ref >39.00)
LDL Cholesterol: 131 mg/dL — ABNORMAL HIGH (ref 0–99)
NonHDL: 151.2
Total CHOL/HDL Ratio: 3
Triglycerides: 103 mg/dL (ref 0.0–149.0)
VLDL: 20.6 mg/dL (ref 0.0–40.0)

## 2015-01-07 LAB — TSH: TSH: 0.58 u[IU]/mL (ref 0.35–4.50)

## 2015-01-07 MED ORDER — DICLOFENAC SODIUM 75 MG PO TBEC: 75.0000 mg | DELAYED_RELEASE_TABLET | Freq: Every day | ORAL | Status: DC

## 2015-01-07 MED ORDER — LEVOTHYROXINE SODIUM 100 MCG PO TABS: 100.0000 ug | ORAL_TABLET | Freq: Every day | ORAL | Status: DC

## 2015-01-07 NOTE — Progress Notes (Signed)
   Subjective:    Patient ID: Stacy Gillespie, female    DOB: [birth year], 64 y.o.   MRN: [id number]  HPI 64 yr old female to follow up on several things. First off she is pleased to tell us that she has stopped smoking. This went better than what she was expecting although she has gained a little weight. She watches her diet closely especially for fatty foods. She still has a chronic dry cough. She has has some stiffness and pain in a lot of her joints. She has decreased her Crestor dosing to every other day. She still sees Dr. Phineas Real for GYN exams.    Review of Systems  Constitutional: Negative.   HENT: Negative.   Eyes: Negative.   Respiratory: Negative.   Cardiovascular: Negative.   Gastrointestinal: Negative.   Genitourinary: Negative for dysuria, urgency, frequency, hematuria, flank pain, decreased urine volume, enuresis, difficulty urinating, pelvic pain and dyspareunia.  Musculoskeletal: Positive for myalgias and arthralgias. Negative for back pain, joint swelling, gait problem, neck pain and neck stiffness.  Skin: Negative.   Neurological: Negative.   Psychiatric/Behavioral: Negative.        Objective:   Physical Exam  Constitutional: She is oriented to person, place, and time. She appears well-developed and well-nourished. No distress.  HENT:  Head: Normocephalic and atraumatic.  Right Ear: External ear normal.  Left Ear: External ear normal.  Nose: Nose normal.  Mouth/Throat: Oropharynx is clear and moist. No oropharyngeal exudate.  Eyes: Conjunctivae and EOM are normal. Pupils are equal, round, and reactive to light. No scleral icterus.  Neck: Normal range of motion. Neck supple. No JVD present. No thyromegaly present.  Cardiovascular: Normal rate, regular rhythm, normal heart sounds and intact distal pulses.  Exam reveals no gallop and no friction rub.   No murmur heard. EKG normal   Pulmonary/Chest: Effort normal and breath sounds normal. No respiratory distress.  She has no wheezes. She has no rales. She exhibits no tenderness.  Abdominal: Soft. Bowel sounds are normal. She exhibits no distension and no mass. There is no tenderness. There is no rebound and no guarding.  Musculoskeletal: Normal range of motion. She exhibits no edema or tenderness.  Lymphadenopathy:    She has no cervical adenopathy.  Neurological: She is alert and oriented to person, place, and time. She has normal reflexes. No cranial nerve deficit. She exhibits normal muscle tone. Coordination normal.  Skin: Skin is warm and dry. No rash noted. No erythema.  Psychiatric: She has a normal mood and affect. Her behavior is normal. Judgment and thought content normal.          Assessment & Plan:  She is doing well although I think her joint pains are a side effect of the Crestor. I advised her stop the Crestor for one month to see how she feels. Get labs to check her lipids and thyroid levels. Get a CXR to evaluate the cough.

## 2015-03-11 ENCOUNTER — Ambulatory Visit (INDEPENDENT_AMBULATORY_CARE_PROVIDER_SITE_OTHER)
Admission: RE | Admit: 2015-03-11 | Discharge: 2015-03-11 | Disposition: A | Discharge status: Home / Self Care | Payer: Medicare Other | Source: Ambulatory Visit | Attending: Family Medicine | Admitting: Family Medicine

## 2015-03-11 DIAGNOSIS — R05 Cough: Secondary | ICD-10-CM

## 2015-03-11 DIAGNOSIS — R053 Chronic cough: Secondary | ICD-10-CM

## 2015-04-05 ENCOUNTER — Other Ambulatory Visit: Payer: Self-pay | Admitting: Family Medicine

## 2015-04-23 ENCOUNTER — Telehealth: Payer: Self-pay | Admitting: *Deleted

## 2015-04-23 MED ORDER — CONJ ESTROG-MEDROXYPROGEST ACE 0.3-1.5 MG PO TABS: 1.0000 | ORAL_TABLET | Freq: Every day | ORAL | Status: DC

## 2015-04-23 NOTE — Telephone Encounter (Signed)
Pt called requesting Prempro 0.3-1.5 mg, annual on 05/12/15 with Dr.Fontiane. Rx will be sent.

## 2015-05-12 ENCOUNTER — Encounter: Payer: Self-pay | Admitting: Gynecology

## 2015-05-12 ENCOUNTER — Ambulatory Visit (INDEPENDENT_AMBULATORY_CARE_PROVIDER_SITE_OTHER): Payer: Medicare Other | Admitting: Gynecology

## 2015-05-12 VITALS — BP 122/78 | Ht 67.0 in | Wt 150.0 lb

## 2015-05-12 DIAGNOSIS — N952 Postmenopausal atrophic vaginitis: Secondary | ICD-10-CM

## 2015-05-12 DIAGNOSIS — Z79890 Hormone replacement therapy (postmenopausal): Secondary | ICD-10-CM | POA: Diagnosis not present

## 2015-05-12 DIAGNOSIS — M8580 Other specified disorders of bone density and structure, unspecified site: Secondary | ICD-10-CM | POA: Diagnosis not present

## 2015-05-12 DIAGNOSIS — N951 Menopausal and female climacteric states: Secondary | ICD-10-CM

## 2015-05-12 MED ORDER — CONJ ESTROG-MEDROXYPROGEST ACE 0.3-1.5 MG PO TABS: 1.0000 | ORAL_TABLET | Freq: Every day | ORAL | Status: DC

## 2015-05-12 NOTE — Patient Instructions (Signed)

## 2015-05-12 NOTE — Progress Notes (Signed)
Stacy Gillespie 11/10/1950 [id number]        64 y.o.  G0P0 for Breast and pelvic exam. Several issues noted below.  Past medical history,surgical history, problem list, medications, allergies, family history and social history were all reviewed and documented as reviewed in the EPIC chart.  ROS:  Performed with pertinent positives and negatives included in the history, assessment and plan.   Additional significant findings :  none   Exam: Kim Counsellor Vitals:   05/12/15 1143  BP: 122/78  Height: 5\' 7"  (1.702 m)  Weight: 150 lb (68.04 kg)   General appearance:  Normal affect, orientation and appearance. Skin: Grossly normal HEENT: Without gross lesions.  No cervical or supraclavicular adenopathy. Thyroid normal.  Lungs:  Clear without wheezing, rales or rhonchi Cardiac: RR, without RMG Abdominal:  Soft, nontender, without masses, guarding, rebound, organomegaly or hernia Breasts:  Examined lying and sitting without masses, retractions, discharge or axillary adenopathy. Pelvic:  Ext/BUS/vagina with atrophic changes  Cervix with atrophic changes  Uterus anteverted, normal size, shape and contour, midline and mobile nontender   Adnexa  Without masses or tenderness    Anus and perineum  Normal   Rectovaginal  Normal sphincter tone without palpated masses or tenderness.    Assessment/Plan:  64 y.o. G0P0 female for breast and pelvic exam.   1. Postmenopausal/atrophic genital changes/HRT. Patient continues using Prempro 0.3/1.5. Uses it every other to every third day during the winter but every day during the summer due to significant increases in her hot flushes. I again reviewed the whole issue of HRT to include increased risk of stroke heart attack DVT and breast cancer. Issues of using it as she ages and possible increased risk of stroke. Availability of OTC products to see if these do not work also discussed. At this point patient wants to continue and I refilled her 1 year.  Has had no vaginal bleeding and she knows to report any vaginal bleeding. Is doing fine as far as vaginal dryness. 2. Osteopenia.  DEXA 2015 T score -1.3 FRAX 6.8%/0.9%. Vitamin D 29 last year at Dr. Barbie Banner office. Recommended she increase her vitamin D 1000 units daily and then to have her level rechecked the next time she has blood drawn at his office. Plan on repeat DEXA at two-year interval. 3. Pap smear/HPV negative 2015. No Pap smear done today. No history of significant abnormal Pap smears. 4. Mammography 06/2014. Repeat mammogram end of this year when due. SBE monthly reviewed. 5. Colonoscopy 2013. Repeat at their recommended interval. 6. Health maintenance. No routine lab work done as this is done at her primary physician's office. Follow up in one year, sooner as needed.   Anastasio Auerbach MD, 12:09 PM 05/12/2015

## 2015-05-13 ENCOUNTER — Ambulatory Visit (INDEPENDENT_AMBULATORY_CARE_PROVIDER_SITE_OTHER): Payer: Medicare Other | Admitting: Family Medicine

## 2015-05-13 DIAGNOSIS — Z23 Encounter for immunization: Secondary | ICD-10-CM

## 2015-06-29 ENCOUNTER — Ambulatory Visit (INDEPENDENT_AMBULATORY_CARE_PROVIDER_SITE_OTHER): Payer: Medicare Other | Admitting: Family Medicine

## 2015-06-29 ENCOUNTER — Encounter: Payer: Self-pay | Admitting: Family Medicine

## 2015-06-29 VITALS — BP 138/74 | HR 84 | Temp 99.4°F | Ht 67.0 in | Wt 150.0 lb

## 2015-06-29 DIAGNOSIS — A09 Infectious gastroenteritis and colitis, unspecified: Secondary | ICD-10-CM

## 2015-06-29 DIAGNOSIS — R197 Diarrhea, unspecified: Secondary | ICD-10-CM

## 2015-06-29 LAB — BASIC METABOLIC PANEL
BUN: 10 mg/dL (ref 6–23)
CO2: 28 mEq/L (ref 19–32)
Calcium: 9 mg/dL (ref 8.4–10.5)
Chloride: 102 mEq/L (ref 96–112)
Creatinine, Ser: 0.77 mg/dL (ref 0.40–1.20)
GFR: 79.6 mL/min (ref >60.00)
Glucose, Bld: 90 mg/dL (ref 70–99)
Potassium: 3.1 mEq/L — ABNORMAL LOW (ref 3.5–5.1)
Sodium: 140 mEq/L (ref 135–145)

## 2015-06-29 LAB — CBC WITH DIFFERENTIAL/PLATELET
Basophils Absolute: 0 10*3/uL (ref 0.0–0.1)
Basophils Relative: 0.3 % (ref 0.0–3.0)
Eosinophils Absolute: 0.2 10*3/uL (ref 0.0–0.7)
Eosinophils Relative: 2.2 % (ref 0.0–5.0)
HCT: 37 % (ref 36.0–46.0)
Hemoglobin: 12.2 g/dL (ref 12.0–15.0)
Lymphocytes Relative: 15.4 % (ref 12.0–46.0)
Lymphs Abs: 1.3 10*3/uL (ref 0.7–4.0)
MCHC: 33 g/dL (ref 30.0–36.0)
MCV: 89.9 fl (ref 78.0–100.0)
Monocytes Absolute: 1 10*3/uL (ref 0.1–1.0)
Monocytes Relative: 11.6 % (ref 3.0–12.0)
Neutro Abs: 5.9 10*3/uL (ref 1.4–7.7)
Neutrophils Relative %: 70.5 % (ref 43.0–77.0)
Platelets: 408 10*3/uL — ABNORMAL HIGH (ref 150.0–400.0)
RBC: 4.11 Mil/uL (ref 3.87–5.11)
RDW: 12.7 % (ref 11.5–15.5)
WBC: 8.3 10*3/uL (ref 4.0–10.5)

## 2015-06-29 LAB — HEPATIC FUNCTION PANEL
ALT: 9 U/L (ref 0–35)
AST: 12 U/L (ref 0–37)
Albumin: 4 g/dL (ref 3.5–5.2)
Alkaline Phosphatase: 93 U/L (ref 39–117)
Bilirubin, Direct: 0.1 mg/dL (ref 0.0–0.3)
Total Bilirubin: 0.3 mg/dL (ref 0.2–1.2)
Total Protein: 6.8 g/dL (ref 6.0–8.3)

## 2015-06-29 LAB — LIPASE: Lipase: 14 U/L (ref 11.0–59.0)

## 2015-06-29 LAB — AMYLASE: Amylase: 35 U/L (ref 27–131)

## 2015-06-29 MED ORDER — METRONIDAZOLE 500 MG PO TABS: 500.0000 mg | ORAL_TABLET | Freq: Three times a day (TID) | ORAL | Status: DC

## 2015-06-29 NOTE — Progress Notes (Signed)
   Subjective:    Patient ID: Stacy Gillespie, female    DOB: October 25, 1950, 64 y.o.   MRN: [id number]  HPI Here for 3 weeks of diarrhea with low grade fevers and lower abdominal cramps. She has been nauseated at times but has not vomited. No recent travel. However she has been seeing her dentist Dr. Satira Sark for an abscessed tooth, and he started her on Clindamycin 500 mg QID on 06-02-15. About a week after that her GI symptoms began. No urinary symptoms.    Review of Systems  Constitutional: Negative.   Respiratory: Negative.   Cardiovascular: Negative.   Gastrointestinal: Positive for nausea, abdominal pain and diarrhea. Negative for vomiting, constipation, blood in stool, abdominal distention, anal bleeding and rectal pain.  Genitourinary: Negative.        Objective:   Physical Exam  Constitutional: She appears well-developed and well-nourished.  Cardiovascular: Normal rate, regular rhythm, normal heart sounds and intact distal pulses.   Pulmonary/Chest: Effort normal and breath sounds normal.  Abdominal: Soft. Bowel sounds are normal. She exhibits no distension and no mass. There is no rebound and no guarding.  Mildly tender in both lower quadrants           Assessment & Plan:  This is antibiotic associated diarrhea, with a possibility of being C diff. Stop the Clindamycin. Start on Flagyl 500 mg tid for 10 days. Get labs including a CBC and C diff toxin. Drink plenty of water and Gatorade. Take a probiotic like Align daily.

## 2015-06-29 NOTE — Progress Notes (Signed)
Pre visit review using our clinic review tool, if applicable. No additional management support is needed unless otherwise documented below in the visit note. 

## 2015-07-01 ENCOUNTER — Telehealth: Payer: Self-pay

## 2015-07-01 LAB — CLOSTRIDIUM DIFFICILE BY PCR: Toxigenic C. Difficile by PCR: DETECTED — CR

## 2015-07-01 NOTE — Telephone Encounter (Signed)
CRITICAL VALUE STICKER  CRITICAL VALUE: C DIFF Detected   RECEIVER (INITALS): ACM   DATE & TIME NOTIFIED:  12./.2016 at 2:58 pm.   MD NOTIFIED: Dr. Sarajane Jews   TIME OF NOTIFICATION: 2:59 pm   RESPONSE:

## 2015-07-02 ENCOUNTER — Telehealth: Payer: Self-pay | Admitting: Family Medicine

## 2015-07-02 ENCOUNTER — Other Ambulatory Visit: Payer: Self-pay | Admitting: Family Medicine

## 2015-07-02 DIAGNOSIS — A09 Infectious gastroenteritis and colitis, unspecified: Secondary | ICD-10-CM

## 2015-07-02 NOTE — Telephone Encounter (Signed)
Per Dr. Sarajane Jews order a Cdiff, I put in order and spoke with pt.

## 2015-07-02 NOTE — Telephone Encounter (Signed)
This has been addressed.

## 2015-07-09 ENCOUNTER — Other Ambulatory Visit: Payer: Self-pay

## 2015-07-09 DIAGNOSIS — A09 Infectious gastroenteritis and colitis, unspecified: Secondary | ICD-10-CM

## 2015-07-14 ENCOUNTER — Other Ambulatory Visit: Payer: Medicare Other

## 2015-07-14 DIAGNOSIS — A0472 Enterocolitis due to Clostridium difficile, not specified as recurrent: Secondary | ICD-10-CM

## 2015-07-14 DIAGNOSIS — R195 Other fecal abnormalities: Secondary | ICD-10-CM

## 2015-07-15 LAB — CLOSTRIDIUM DIFFICILE BY PCR: Toxigenic C. Difficile by PCR: NOT DETECTED

## 2015-07-27 ENCOUNTER — Telehealth: Payer: Self-pay | Admitting: Family Medicine

## 2015-07-27 NOTE — Telephone Encounter (Signed)
Patient states that she has been experiencing watery stool and has recently tested positive for c diff and patient wants to know if she needs to have another sample done or come in to see the MD. Patient states that she has not experience any blood but having some of symptoms from last time such bloating, and abdominal pain not as bad. Patient had sample done after round of antibiotics

## 2015-07-27 NOTE — Telephone Encounter (Signed)
She has already tested negative so the C diff infection appears to have resolved. If she is still having diarrhea she should make an OV to discuss this

## 2015-07-28 NOTE — Telephone Encounter (Signed)
Patient notified of Dr. Barbie Banner comments. Patient states that she wants to see how she does today, and will call and schedule appt if no better by tomorrow.

## 2015-09-03 ENCOUNTER — Other Ambulatory Visit: Payer: Self-pay

## 2015-09-03 DIAGNOSIS — Z1231 Encounter for screening mammogram for malignant neoplasm of breast: Secondary | ICD-10-CM

## 2015-09-09 DIAGNOSIS — M5126 Other intervertebral disc displacement, lumbar region: Secondary | ICD-10-CM | POA: Diagnosis not present

## 2015-11-09 ENCOUNTER — Ambulatory Visit
Admission: RE | Admit: 2015-11-09 | Discharge: 2015-11-09 | Disposition: A | Discharge status: Home / Self Care | Payer: Medicare Other | Source: Ambulatory Visit

## 2015-11-09 DIAGNOSIS — Z1231 Encounter for screening mammogram for malignant neoplasm of breast: Secondary | ICD-10-CM | POA: Diagnosis not present

## 2016-01-07 ENCOUNTER — Ambulatory Visit (INDEPENDENT_AMBULATORY_CARE_PROVIDER_SITE_OTHER): Payer: Medicare Other | Admitting: Family Medicine

## 2016-01-07 ENCOUNTER — Encounter: Payer: Self-pay | Admitting: Family Medicine

## 2016-01-07 VITALS — BP 103/74 | HR 78 | Temp 98.6°F | Ht 67.0 in | Wt 147.0 lb

## 2016-01-07 DIAGNOSIS — E039 Hypothyroidism, unspecified: Secondary | ICD-10-CM | POA: Diagnosis not present

## 2016-01-07 DIAGNOSIS — E785 Hyperlipidemia, unspecified: Secondary | ICD-10-CM | POA: Diagnosis not present

## 2016-01-07 DIAGNOSIS — M129 Arthropathy, unspecified: Secondary | ICD-10-CM | POA: Diagnosis not present

## 2016-01-07 DIAGNOSIS — E559 Vitamin D deficiency, unspecified: Secondary | ICD-10-CM | POA: Diagnosis not present

## 2016-01-07 DIAGNOSIS — M109 Gout, unspecified: Secondary | ICD-10-CM

## 2016-01-07 LAB — POC URINALSYSI DIPSTICK (AUTOMATED)
Bilirubin, UA: NEGATIVE
Glucose, UA: NEGATIVE
Ketones, UA: NEGATIVE
Leukocytes, UA: NEGATIVE
Nitrite, UA: NEGATIVE
Protein, UA: NEGATIVE
Spec Grav, UA: 1.02
Urobilinogen, UA: 0.2
pH, UA: 5.5

## 2016-01-07 LAB — CBC WITH DIFFERENTIAL/PLATELET
Basophils Absolute: 0 10*3/uL (ref 0.0–0.1)
Basophils Relative: 0.3 % (ref 0.0–3.0)
Eosinophils Absolute: 0 10*3/uL (ref 0.0–0.7)
Eosinophils Relative: 0.3 % (ref 0.0–5.0)
HCT: 38.1 % (ref 36.0–46.0)
Hemoglobin: 12.9 g/dL (ref 12.0–15.0)
Lymphocytes Relative: 12.5 % (ref 12.0–46.0)
Lymphs Abs: 1.4 10*3/uL (ref 0.7–4.0)
MCHC: 33.9 g/dL (ref 30.0–36.0)
MCV: 89.8 fl (ref 78.0–100.0)
Monocytes Absolute: 0.7 10*3/uL (ref 0.1–1.0)
Monocytes Relative: 6.5 % (ref 3.0–12.0)
Neutro Abs: 9 10*3/uL — ABNORMAL HIGH (ref 1.4–7.7)
Neutrophils Relative %: 80.4 % — ABNORMAL HIGH (ref 43.0–77.0)
Platelets: 406 10*3/uL — ABNORMAL HIGH (ref 150.0–400.0)
RBC: 4.24 Mil/uL (ref 3.87–5.11)
RDW: 12.8 % (ref 11.5–15.5)
WBC: 11.2 10*3/uL — ABNORMAL HIGH (ref 4.0–10.5)

## 2016-01-07 LAB — BASIC METABOLIC PANEL
BUN: 19 mg/dL (ref 6–23)
CO2: 29 mEq/L (ref 19–32)
Calcium: 9.7 mg/dL (ref 8.4–10.5)
Chloride: 100 mEq/L (ref 96–112)
Creatinine, Ser: 0.92 mg/dL (ref 0.40–1.20)
GFR: 64.72 mL/min (ref >60.00)
Glucose, Bld: 90 mg/dL (ref 70–99)
Potassium: 4.6 mEq/L (ref 3.5–5.1)
Sodium: 137 mEq/L (ref 135–145)

## 2016-01-07 LAB — LIPID PANEL
Cholesterol: 209 mg/dL — ABNORMAL HIGH (ref 0–200)
HDL: 72.3 mg/dL (ref >39.00)
LDL Cholesterol: 117 mg/dL — ABNORMAL HIGH (ref 0–99)
NonHDL: 137.07
Total CHOL/HDL Ratio: 3
Triglycerides: 98 mg/dL (ref 0.0–149.0)
VLDL: 19.6 mg/dL (ref 0.0–40.0)

## 2016-01-07 LAB — TSH: TSH: 0.86 u[IU]/mL (ref 0.35–4.50)

## 2016-01-07 LAB — HEPATIC FUNCTION PANEL
ALT: 11 U/L (ref 0–35)
AST: 14 U/L (ref 0–37)
Albumin: 4.2 g/dL (ref 3.5–5.2)
Alkaline Phosphatase: 90 U/L (ref 39–117)
Bilirubin, Direct: 0.1 mg/dL (ref 0.0–0.3)
Total Bilirubin: 0.3 mg/dL (ref 0.2–1.2)
Total Protein: 6.9 g/dL (ref 6.0–8.3)

## 2016-01-07 LAB — VITAMIN D 25 HYDROXY (VIT D DEFICIENCY, FRACTURES): VITD: 29.37 ng/mL — ABNORMAL LOW (ref 30.00–100.00)

## 2016-01-07 MED ORDER — DICLOFENAC SODIUM 75 MG PO TBEC: 75.0000 mg | DELAYED_RELEASE_TABLET | Freq: Every day | ORAL | Status: DC

## 2016-01-07 MED ORDER — ROSUVASTATIN CALCIUM 10 MG PO TABS: ORAL_TABLET | ORAL | Status: DC

## 2016-01-07 MED ORDER — LEVOTHYROXINE SODIUM 100 MCG PO TABS: 100.0000 ug | ORAL_TABLET | Freq: Every day | ORAL | Status: DC

## 2016-01-07 NOTE — Progress Notes (Signed)
   Subjective:    Patient ID: Stacy Gillespie, female    DOB: [birth year], 65 y.o.   MRN: [id number]  HPI 65 yr old female to follow up on lipids, arthritis, and hypothyroidism. She feels well in general and she remains active. She gets regular mammograms.    Review of Systems  Constitutional: Negative.   HENT: Negative.   Eyes: Negative.   Respiratory: Negative.   Cardiovascular: Negative.   Gastrointestinal: Negative.   Genitourinary: Negative for dysuria, urgency, frequency, hematuria, flank pain, decreased urine volume, enuresis, difficulty urinating, pelvic pain and dyspareunia.  Musculoskeletal: Negative.   Skin: Negative.   Neurological: Negative.   Psychiatric/Behavioral: Negative.        Objective:   Physical Exam  Constitutional: She is oriented to person, place, and time. She appears well-developed and well-nourished. No distress.  HENT:  Head: Normocephalic and atraumatic.  Right Ear: External ear normal.  Left Ear: External ear normal.  Nose: Nose normal.  Mouth/Throat: Oropharynx is clear and moist. No oropharyngeal exudate.  Eyes: Conjunctivae and EOM are normal. Pupils are equal, round, and reactive to light. No scleral icterus.  Neck: Normal range of motion. Neck supple. No JVD present. No thyromegaly present.  Cardiovascular: Normal rate, regular rhythm, normal heart sounds and intact distal pulses.  Exam reveals no gallop and no friction rub.   No murmur heard. EKG normal   Pulmonary/Chest: Effort normal and breath sounds normal. No respiratory distress. She has no wheezes. She has no rales. She exhibits no tenderness.  Abdominal: Soft. Bowel sounds are normal. She exhibits no distension and no mass. There is no tenderness. There is no rebound and no guarding.  Musculoskeletal: Normal range of motion. She exhibits no edema or tenderness.  Lymphadenopathy:    She has no cervical adenopathy.  Neurological: She is alert and oriented to person, place, and time.  She has normal reflexes. No cranial nerve deficit. She exhibits normal muscle tone. Coordination normal.  Skin: Skin is warm and dry. No rash noted. No erythema.  Psychiatric: She has a normal mood and affect. Her behavior is normal. Judgment and thought content normal.          Assessment & Plan:  Her arthritis is stable. For the thyroid and the lipids, she will get fasting labs today.  Laurey Morale, MD

## 2016-01-07 NOTE — Progress Notes (Signed)
Pre visit review using our clinic review tool, if applicable. No additional management support is needed unless otherwise documented below in the visit note. 

## 2016-05-12 ENCOUNTER — Encounter: Payer: Self-pay | Admitting: Gynecology

## 2016-05-12 ENCOUNTER — Ambulatory Visit (INDEPENDENT_AMBULATORY_CARE_PROVIDER_SITE_OTHER): Payer: Medicare Other | Admitting: Gynecology

## 2016-05-12 VITALS — BP 120/74 | Ht 66.0 in | Wt 150.0 lb

## 2016-05-12 DIAGNOSIS — N952 Postmenopausal atrophic vaginitis: Secondary | ICD-10-CM

## 2016-05-12 DIAGNOSIS — Z79890 Hormone replacement therapy (postmenopausal): Secondary | ICD-10-CM

## 2016-05-12 DIAGNOSIS — M8580 Other specified disorders of bone density and structure, unspecified site: Secondary | ICD-10-CM

## 2016-05-12 DIAGNOSIS — Z01419 Encounter for gynecological examination (general) (routine) without abnormal findings: Secondary | ICD-10-CM

## 2016-05-12 MED ORDER — CONJ ESTROG-MEDROXYPROGEST ACE 0.3-1.5 MG PO TABS: 1.0000 | ORAL_TABLET | Freq: Every day | ORAL | 4 refills | Status: DC

## 2016-05-12 MED ORDER — ESTROGENS, CONJUGATED 0.625 MG/GM VA CREA: 1.0000 | TOPICAL_CREAM | VAGINAL | 5 refills | Status: DC

## 2016-05-12 NOTE — Patient Instructions (Signed)

## 2016-05-12 NOTE — Progress Notes (Signed)
    Stacy Gillespie 02-05-1951 [id number]        65 y.o.  G0P0  for breast and pelvic exam. Several issues noted below.  Past medical history,surgical history, problem list, medications, allergies, family history and social history were all reviewed and documented as reviewed in the EPIC chart.  ROS:  Performed with pertinent positives and negatives included in the history, assessment and plan.   Additional significant findings :  None   Exam: Caryn Bee assistant Vitals:   05/12/16 1118  BP: 120/74  Weight: 150 lb (68 kg)  Height: 5\' 6"  (1.676 m)   Body mass index is 24.21 kg/m.  General appearance:  Normal affect, orientation and appearance. Skin: Grossly normal HEENT: Without gross lesions.  No cervical or supraclavicular adenopathy. Thyroid normal.  Lungs:  Clear without wheezing, rales or rhonchi Cardiac: RR, without RMG Abdominal:  Soft, nontender, without masses, guarding, rebound, organomegaly or hernia Breasts:  Examined lying and sitting without masses, retractions, discharge or axillary adenopathy. Pelvic:  Ext, BUS, Vagina with atrophic changes  Cervix with atrophic changes  Uterus anteverted, normal size, shape and contour, midline and mobile nontender   Adnexa without masses or tenderness    Anus and perineum normal   Rectovaginal normal sphincter tone without palpated masses or tenderness.    Assessment/Plan:  65 y.o. G0P0 female for breast and pelvic exam.   1. Postmenopausal/atrophic genital changes/HRT. Patient continues on Prempro 0.3/1.5. Also uses vaginal Premarin cream intermittently if she starts to feel dry. Does not use it consistently though. I reviewed the most current NAMS 2017 HRT guidelines. I discussed the risks to include thrombosis such as stroke heart attack DVT and breast cancer. I discussed the benefits as far as symptom relief and possible cardiovascular/bone health if started early. I reviewed different formulations to include  transdermal versus oral, first pass effect as well as different progesterones. I discussed whether medroxyprogesterone was the reason for increased breast cancer risk in the WHI study and if we switched her to a naturalized progesterone with that be  "safer". After a lengthy discussion in the patient being fully informed and understanding the issues she prefers to continue what she is doing now and continue on Prempro. I refilled her 1 year. I also refilled the vaginal estrogen cream she uses occasionally. 2. Osteopenia. DEXA 2015 T score -1.3 FRAX 6.8%/0.9%. Had marginal vitamin D this past June as Dr. Barbie Banner office and she doubled her vitamin D to 2000 units daily. She is going to follow up with them to have the level rechecked. Recommended repeat DEXA now a 2 year interval she's going to schedule this. 3. Pap smear/HPV negative 2015. No Pap smear done today. No history of significant abnormal Pap smears. 4. Mammography 10/2015. Continue with annual mammography when due. SBE monthly reviewed. 5. Colonoscopy 2013. Repeat at their recommended interval. 6. Health maintenance. No routine lab work done as this is done at Dr. Barbie Banner office. Follow up 1 year, sooner as needed.  Additional time in excess of her breast and pelvic exam was spent in direct face to face counseling and coordination of care in regards to her postmenopausal status with HRT, reviewing most current guidelines and risk versus benefit discussion.    Anastasio Auerbach MD, 11:58 AM 05/12/2016

## 2016-05-17 ENCOUNTER — Ambulatory Visit (INDEPENDENT_AMBULATORY_CARE_PROVIDER_SITE_OTHER): Payer: Medicare Other | Admitting: Family Medicine

## 2016-05-17 DIAGNOSIS — Z23 Encounter for immunization: Secondary | ICD-10-CM | POA: Diagnosis not present

## 2016-05-24 DEATH — deceased

## 2016-06-12 DIAGNOSIS — R194 Change in bowel habit: Secondary | ICD-10-CM | POA: Diagnosis not present

## 2016-06-12 DIAGNOSIS — Z86010 Personal history of colonic polyps: Secondary | ICD-10-CM | POA: Diagnosis not present

## 2016-06-20 ENCOUNTER — Ambulatory Visit (INDEPENDENT_AMBULATORY_CARE_PROVIDER_SITE_OTHER): Payer: Medicare Other

## 2016-06-20 ENCOUNTER — Other Ambulatory Visit: Payer: Self-pay | Admitting: Gynecology

## 2016-06-20 ENCOUNTER — Encounter: Payer: Self-pay | Admitting: Gynecology

## 2016-06-20 DIAGNOSIS — M899 Disorder of bone, unspecified: Secondary | ICD-10-CM

## 2016-06-20 DIAGNOSIS — Z8739 Personal history of other diseases of the musculoskeletal system and connective tissue: Secondary | ICD-10-CM

## 2016-06-20 DIAGNOSIS — M8580 Other specified disorders of bone density and structure, unspecified site: Secondary | ICD-10-CM

## 2016-06-23 DEATH — deceased

## 2016-06-29 ENCOUNTER — Other Ambulatory Visit: Payer: Self-pay

## 2016-08-25 ENCOUNTER — Telehealth: Payer: Self-pay | Admitting: Family Medicine

## 2016-09-27 NOTE — Telephone Encounter (Signed)
Pt states she will not do a medicare well visit this year.  Will only see Dr Sarajane Jews for a follow up.

## 2016-10-05 ENCOUNTER — Other Ambulatory Visit: Payer: Self-pay | Admitting: Gynecology

## 2016-10-05 DIAGNOSIS — Z1231 Encounter for screening mammogram for malignant neoplasm of breast: Secondary | ICD-10-CM

## 2016-11-09 ENCOUNTER — Ambulatory Visit
Admission: RE | Admit: 2016-11-09 | Discharge: 2016-11-09 | Disposition: A | Discharge status: Home / Self Care | Payer: Medicare Other | Source: Ambulatory Visit | Attending: Gynecology | Admitting: Gynecology

## 2016-11-09 DIAGNOSIS — Z1231 Encounter for screening mammogram for malignant neoplasm of breast: Secondary | ICD-10-CM

## 2016-12-04 ENCOUNTER — Other Ambulatory Visit: Payer: Self-pay | Admitting: Family Medicine

## 2016-12-04 NOTE — Telephone Encounter (Signed)
Drug allergy listed, can we refill these?

## 2017-01-09 ENCOUNTER — Encounter: Payer: Self-pay | Admitting: Family Medicine

## 2017-01-09 ENCOUNTER — Ambulatory Visit (INDEPENDENT_AMBULATORY_CARE_PROVIDER_SITE_OTHER): Payer: Medicare Other | Admitting: Family Medicine

## 2017-01-09 VITALS — BP 115/82 | HR 70 | Temp 98.3°F | Ht 66.0 in | Wt 150.0 lb

## 2017-01-09 DIAGNOSIS — R05 Cough: Secondary | ICD-10-CM | POA: Diagnosis not present

## 2017-01-09 DIAGNOSIS — E039 Hypothyroidism, unspecified: Secondary | ICD-10-CM

## 2017-01-09 DIAGNOSIS — E782 Mixed hyperlipidemia: Secondary | ICD-10-CM | POA: Diagnosis not present

## 2017-01-09 DIAGNOSIS — M1000 Idiopathic gout, unspecified site: Secondary | ICD-10-CM

## 2017-01-09 DIAGNOSIS — R059 Cough, unspecified: Secondary | ICD-10-CM

## 2017-01-09 DIAGNOSIS — L309 Dermatitis, unspecified: Secondary | ICD-10-CM

## 2017-01-09 DIAGNOSIS — E559 Vitamin D deficiency, unspecified: Secondary | ICD-10-CM

## 2017-01-09 LAB — POC URINALSYSI DIPSTICK (AUTOMATED)
Bilirubin, UA: NEGATIVE
Glucose, UA: NEGATIVE
Ketones, UA: NEGATIVE
Leukocytes, UA: NEGATIVE
Nitrite, UA: NEGATIVE
Protein, UA: NEGATIVE
Spec Grav, UA: 1.025 (ref 1.010–1.025)
Urobilinogen, UA: 0.2 E.U./dL
pH, UA: 6 (ref 5.0–8.0)

## 2017-01-09 LAB — HEPATIC FUNCTION PANEL
ALT: 14 U/L (ref 0–35)
AST: 16 U/L (ref 0–37)
Albumin: 4.3 g/dL (ref 3.5–5.2)
Alkaline Phosphatase: 108 U/L (ref 39–117)
Bilirubin, Direct: 0.1 mg/dL (ref 0.0–0.3)
Total Bilirubin: 0.4 mg/dL (ref 0.2–1.2)
Total Protein: 6.9 g/dL (ref 6.0–8.3)

## 2017-01-09 LAB — LIPID PANEL
Cholesterol: 201 mg/dL — ABNORMAL HIGH (ref 0–200)
HDL: 65.4 mg/dL (ref >39.00)
LDL Cholesterol: 114 mg/dL — ABNORMAL HIGH (ref 0–99)
NonHDL: 135.58
Total CHOL/HDL Ratio: 3
Triglycerides: 108 mg/dL (ref 0.0–149.0)
VLDL: 21.6 mg/dL (ref 0.0–40.0)

## 2017-01-09 LAB — CBC WITH DIFFERENTIAL/PLATELET
Basophils Absolute: 0.1 10*3/uL (ref 0.0–0.1)
Basophils Relative: 0.6 % (ref 0.0–3.0)
Eosinophils Absolute: 0.1 10*3/uL (ref 0.0–0.7)
Eosinophils Relative: 1.3 % (ref 0.0–5.0)
HCT: 38.6 % (ref 36.0–46.0)
Hemoglobin: 13 g/dL (ref 12.0–15.0)
Lymphocytes Relative: 18.3 % (ref 12.0–46.0)
Lymphs Abs: 1.7 10*3/uL (ref 0.7–4.0)
MCHC: 33.7 g/dL (ref 30.0–36.0)
MCV: 90.7 fl (ref 78.0–100.0)
Monocytes Absolute: 0.7 10*3/uL (ref 0.1–1.0)
Monocytes Relative: 7.5 % (ref 3.0–12.0)
Neutro Abs: 6.9 10*3/uL (ref 1.4–7.7)
Neutrophils Relative %: 72.3 % (ref 43.0–77.0)
Platelets: 379 10*3/uL (ref 150.0–400.0)
RBC: 4.25 Mil/uL (ref 3.87–5.11)
RDW: 12.6 % (ref 11.5–15.5)
WBC: 9.6 10*3/uL (ref 4.0–10.5)

## 2017-01-09 LAB — BASIC METABOLIC PANEL
BUN: 22 mg/dL (ref 6–23)
CO2: 29 mEq/L (ref 19–32)
Calcium: 10 mg/dL (ref 8.4–10.5)
Chloride: 100 mEq/L (ref 96–112)
Creatinine, Ser: 0.96 mg/dL (ref 0.40–1.20)
GFR: 61.43 mL/min (ref >60.00)
Glucose, Bld: 90 mg/dL (ref 70–99)
Potassium: 4.8 mEq/L (ref 3.5–5.1)
Sodium: 138 mEq/L (ref 135–145)

## 2017-01-09 LAB — VITAMIN D 25 HYDROXY (VIT D DEFICIENCY, FRACTURES): VITD: 32.11 ng/mL (ref 30.00–100.00)

## 2017-01-09 LAB — TSH: TSH: 0.65 u[IU]/mL (ref 0.35–4.50)

## 2017-01-09 MED ORDER — TRIAMCINOLONE ACETONIDE 0.1 % EX CREA: 1.0000 "application " | TOPICAL_CREAM | Freq: Two times a day (BID) | CUTANEOUS | 2 refills | Status: DC

## 2017-01-09 MED ORDER — ROSUVASTATIN CALCIUM 10 MG PO TABS: ORAL_TABLET | ORAL | 3 refills | Status: DC

## 2017-01-09 NOTE — Patient Instructions (Signed)
WE NOW OFFER   [facility] FAST TRACK!!!  SAME DAY Appointments for ACUTE CARE  Such as: Sprains, Injuries, cuts, abrasions, rashes, muscle pain, joint pain, back pain Colds, flu, sore throats, headache, allergies, cough, fever  Ear pain, sinus and eye infections Abdominal pain, nausea, vomiting, diarrhea, upset stomach Animal/insect bites  3 Easy Ways to Schedule: Walk-In Scheduling Call in scheduling Mychart Sign-up: [email or web address]         

## 2017-01-09 NOTE — Progress Notes (Signed)
   Subjective:    Patient ID: Stacy Gillespie, female    DOB: 02/12/1951, 66 y.o.   MRN: [id number]  HPI Here to follow up on issues. She feels well in general but she knows she needs more exercise. Her arthritis causes a lot of pain in the ankles, knees, and hips. The Diclofenac helps a lot. She watches her diet closely. She asks about another CXR due to her x of smoking. Her last one was done in August 2016. She asks me to check a spot on her left lower leg that appeared 2 months ago. This itches quite a bit but is not painful.    Review of Systems  Constitutional: Negative.   HENT: Negative.   Eyes: Negative.   Respiratory: Negative.   Cardiovascular: Negative.   Gastrointestinal: Negative.   Genitourinary: Negative for decreased urine volume, difficulty urinating, dyspareunia, dysuria, enuresis, flank pain, frequency, hematuria, pelvic pain and urgency.  Musculoskeletal: Positive for arthralgias. Negative for back pain, gait problem, joint swelling, myalgias, neck pain and neck stiffness.  Skin: Positive for rash. Negative for color change, pallor and wound.  Neurological: Negative.   Psychiatric/Behavioral: Negative.        Objective:   Physical Exam  Constitutional: She is oriented to person, place, and time. She appears well-developed and well-nourished. No distress.  HENT:  Head: Normocephalic and atraumatic.  Right Ear: External ear normal.  Left Ear: External ear normal.  Nose: Nose normal.  Mouth/Throat: Oropharynx is clear and moist. No oropharyngeal exudate.  Eyes: Conjunctivae and EOM are normal. Pupils are equal, round, and reactive to light. No scleral icterus.  Neck: Normal range of motion. Neck supple. No JVD present. No thyromegaly present.  Cardiovascular: Normal rate, regular rhythm, normal heart sounds and intact distal pulses.  Exam reveals no gallop and no friction rub.   No murmur heard. Pulmonary/Chest: Effort normal and breath sounds normal. No  respiratory distress. She has no wheezes. She has no rales. She exhibits no tenderness.  Abdominal: Soft. Bowel sounds are normal. She exhibits no distension and no mass. There is no tenderness. There is no rebound and no guarding.  Musculoskeletal: Normal range of motion. She exhibits no edema or tenderness.  Lymphadenopathy:    She has no cervical adenopathy.  Neurological: She is alert and oriented to person, place, and time. She has normal reflexes. No cranial nerve deficit. She exhibits normal muscle tone. Coordination normal.  Skin: Skin is warm and dry. No erythema.  Left lower leg has an area of macular erythema and scaling   Psychiatric: She has a normal mood and affect. Her behavior is normal. Judgment and thought content normal.          Assessment & Plan:  Her arthritis pain is stable and she will continue to use Diclofenac. I also suggested she try glucosamine with chondroitin sulfate. We discussed forms of exercise she could participate in, and I think either yoga or water aerobics would be a good choice for her. She has a spot of eczema on the leg and she will try Triamcinolone cream for this.  Alysia Penna, MD

## 2017-01-15 ENCOUNTER — Ambulatory Visit: Payer: Medicare Other | Admitting: Family Medicine

## 2017-02-07 ENCOUNTER — Ambulatory Visit (INDEPENDENT_AMBULATORY_CARE_PROVIDER_SITE_OTHER)
Admission: RE | Admit: 2017-02-07 | Discharge: 2017-02-07 | Disposition: A | Discharge status: Home / Self Care | Payer: Medicare Other | Source: Ambulatory Visit | Attending: Family Medicine | Admitting: Family Medicine

## 2017-02-07 DIAGNOSIS — R059 Cough, unspecified: Secondary | ICD-10-CM

## 2017-02-07 DIAGNOSIS — R05 Cough: Secondary | ICD-10-CM

## 2017-02-07 DIAGNOSIS — J449 Chronic obstructive pulmonary disease, unspecified: Secondary | ICD-10-CM | POA: Diagnosis not present

## 2017-04-10 ENCOUNTER — Telehealth: Payer: Self-pay

## 2017-04-10 NOTE — Telephone Encounter (Signed)
  Patient has called with c/o increased fatigue (sleeping 11 hours) and increased hair loss. She would like to know if she can increase her Levothyroxine. She has her lab results for the past few years and noted that her TSH levels have been dropping significantly. She would like to know what you recommend.  Dr. Sarajane Jews - Please advise. Thanks!

## 2017-04-11 ENCOUNTER — Other Ambulatory Visit: Payer: Self-pay | Admitting: Family Medicine

## 2017-04-11 DIAGNOSIS — E039 Hypothyroidism, unspecified: Secondary | ICD-10-CM

## 2017-04-11 NOTE — Telephone Encounter (Signed)
Explain to her that a falling TSH means the amount of thyroid hormone is actually rising in her body, not falling. I put in orders for a full thyroid panel to evaluate this better, so she can make a lab appt

## 2017-04-13 NOTE — Telephone Encounter (Signed)
I left a voice message for pt to return my call.  

## 2017-04-13 NOTE — Telephone Encounter (Signed)
I spoke with pt and went over message.

## 2017-04-27 ENCOUNTER — Other Ambulatory Visit (INDEPENDENT_AMBULATORY_CARE_PROVIDER_SITE_OTHER): Payer: Medicare Other

## 2017-04-27 DIAGNOSIS — E039 Hypothyroidism, unspecified: Secondary | ICD-10-CM | POA: Diagnosis not present

## 2017-04-27 LAB — T4, FREE: Free T4: 1.24 ng/dL (ref 0.60–1.60)

## 2017-04-27 LAB — TSH: TSH: 0.79 u[IU]/mL (ref 0.35–4.50)

## 2017-04-27 LAB — T3, FREE: T3, Free: 2.8 pg/mL (ref 2.3–4.2)

## 2017-05-14 ENCOUNTER — Encounter: Payer: Medicare Other | Admitting: Gynecology

## 2017-05-15 ENCOUNTER — Ambulatory Visit (INDEPENDENT_AMBULATORY_CARE_PROVIDER_SITE_OTHER): Payer: Medicare Other

## 2017-05-15 DIAGNOSIS — Z23 Encounter for immunization: Secondary | ICD-10-CM | POA: Diagnosis not present

## 2017-05-21 ENCOUNTER — Ambulatory Visit (INDEPENDENT_AMBULATORY_CARE_PROVIDER_SITE_OTHER): Payer: Medicare Other | Admitting: Gynecology

## 2017-05-21 ENCOUNTER — Encounter: Payer: Self-pay | Admitting: Gynecology

## 2017-05-21 VITALS — BP 122/74 | Ht 66.0 in | Wt 155.0 lb

## 2017-05-21 DIAGNOSIS — E559 Vitamin D deficiency, unspecified: Secondary | ICD-10-CM | POA: Diagnosis not present

## 2017-05-21 DIAGNOSIS — Z79890 Hormone replacement therapy (postmenopausal): Secondary | ICD-10-CM

## 2017-05-21 DIAGNOSIS — Z01411 Encounter for gynecological examination (general) (routine) with abnormal findings: Secondary | ICD-10-CM

## 2017-05-21 DIAGNOSIS — M8580 Other specified disorders of bone density and structure, unspecified site: Secondary | ICD-10-CM

## 2017-05-21 DIAGNOSIS — M8589 Other specified disorders of bone density and structure, multiple sites: Secondary | ICD-10-CM | POA: Diagnosis not present

## 2017-05-21 DIAGNOSIS — N952 Postmenopausal atrophic vaginitis: Secondary | ICD-10-CM

## 2017-05-21 MED ORDER — CONJ ESTROG-MEDROXYPROGEST ACE 0.3-1.5 MG PO TABS: 1.0000 | ORAL_TABLET | Freq: Every day | ORAL | 4 refills | Status: DC

## 2017-05-21 NOTE — Patient Instructions (Signed)
Follow-up in 1 year for annual exam, sooner as needed. 

## 2017-05-21 NOTE — Progress Notes (Signed)
    Stacy Gillespie [birth year] [id number]        66 y.o.  G0P0 for breast and pelvic exam.  Past medical history,surgical history, problem list, medications, allergies, family history and social history were all reviewed and documented as reviewed in the EPIC chart.  ROS:  Performed with pertinent positives and negatives included in the history, assessment and plan.   Additional significant findings : None   Exam: Caryn Bee assistant Vitals:   05/21/17 1123  BP: 122/74  Weight: 155 lb (70.3 kg)  Height: 5\' 6"  (1.676 m)   Body mass index is 25.02 kg/m.  General appearance:  Normal affect, orientation and appearance. Skin: Grossly normal HEENT: Without gross lesions.  No cervical or supraclavicular adenopathy. Thyroid normal.  Lungs:  Clear without wheezing, rales or rhonchi Cardiac: RR, without RMG Abdominal:  Soft, nontender, without masses, guarding, rebound, organomegaly or hernia Breasts:  Examined lying and sitting without masses, retractions, discharge or axillary adenopathy. Pelvic:  Ext, BUS, Vagina: With atrophic changes  Cervix: With atrophic changes  Uterus: Anteverted, normal size, shape and contour, midline and mobile nontender   Adnexa: Without masses or tenderness    Anus and perineum: Normal   Rectovaginal: Normal sphincter tone without palpated masses or tenderness.    Assessment/Plan:  66 y.o. G0P0 female for breast and pelvic exam.   1. Postmenopausal/atrophic genital changes/HRT.  Continues on Prempro 0.3/1.5 every other day.  No bleeding.  No significant menopausal symptoms.  We again reviewed the HRT issue and most recent data.  Risks versus benefits reviewed to include thrombosis such as stroke heart attack DVT and breast cancer issue.  Benefits to include symptom relief as well as bone health and cardiovascular health discussed particularly when started early.  After lengthy discussion at this point the patient wants to continue and refill times 1  year provided.  We have also reviewed in the past switching to a different regimen which she prefers not to. 2. Osteopenia.  DEXA 2017 T score -1.4 FRAX 8% / 1%.  History of vitamin D deficiency with value 32 in the past.  Is on supplement now.  Check vitamin D level today. 3. Colonoscopy 2013.  Repeat at their recommended interval. 4. Pap smear/HPV 2015.  No Pap smear done today.  No history of significant abnormal Pap smears.  Options to stop screening based on age and current recommendations reviewed.  Will readdress on an annual basis. 5. Mammography 10/2016.  Annual follow-up mammogram when due.  Breast exam normal today. 6. Health maintenance.  No routine lab work done as patient does use elsewhere.  Follow-up 1 year, sooner as needed.   Anastasio Auerbach MD, 11:52 AM 05/21/2017

## 2017-05-22 LAB — VITAMIN D 25 HYDROXY (VIT D DEFICIENCY, FRACTURES): Vit D, 25-Hydroxy: 38 ng/mL (ref 30–100)

## 2017-10-01 DIAGNOSIS — H43391 Other vitreous opacities, right eye: Secondary | ICD-10-CM | POA: Diagnosis not present

## 2017-10-01 DIAGNOSIS — H43811 Vitreous degeneration, right eye: Secondary | ICD-10-CM | POA: Diagnosis not present

## 2017-10-29 DIAGNOSIS — H43811 Vitreous degeneration, right eye: Secondary | ICD-10-CM | POA: Diagnosis not present

## 2017-10-29 DIAGNOSIS — H5319 Other subjective visual disturbances: Secondary | ICD-10-CM | POA: Diagnosis not present

## 2017-10-29 DIAGNOSIS — H43391 Other vitreous opacities, right eye: Secondary | ICD-10-CM | POA: Diagnosis not present

## 2017-11-10 ENCOUNTER — Other Ambulatory Visit: Payer: Self-pay | Admitting: Family Medicine

## 2017-11-12 NOTE — Telephone Encounter (Signed)
Levothyroxine filled to pharmacy as requested.  Please advise diclofenac request. Last filled: 12/04/2016, #90, 3 RF LOV: 04/11/2017

## 2017-12-10 DIAGNOSIS — H43811 Vitreous degeneration, right eye: Secondary | ICD-10-CM | POA: Diagnosis not present

## 2017-12-10 DIAGNOSIS — H5319 Other subjective visual disturbances: Secondary | ICD-10-CM | POA: Diagnosis not present

## 2017-12-10 DIAGNOSIS — H43391 Other vitreous opacities, right eye: Secondary | ICD-10-CM | POA: Diagnosis not present

## 2018-01-11 ENCOUNTER — Ambulatory Visit (INDEPENDENT_AMBULATORY_CARE_PROVIDER_SITE_OTHER): Payer: Medicare Other | Admitting: Family Medicine

## 2018-01-11 ENCOUNTER — Encounter: Payer: Self-pay | Admitting: Family Medicine

## 2018-01-11 VITALS — BP 126/70 | HR 69 | Temp 98.3°F | Ht 66.0 in | Wt 153.2 lb

## 2018-01-11 DIAGNOSIS — E782 Mixed hyperlipidemia: Secondary | ICD-10-CM | POA: Diagnosis not present

## 2018-01-11 DIAGNOSIS — M1000 Idiopathic gout, unspecified site: Secondary | ICD-10-CM

## 2018-01-11 DIAGNOSIS — E559 Vitamin D deficiency, unspecified: Secondary | ICD-10-CM

## 2018-01-11 DIAGNOSIS — H8110 Benign paroxysmal vertigo, unspecified ear: Secondary | ICD-10-CM

## 2018-01-11 DIAGNOSIS — E039 Hypothyroidism, unspecified: Secondary | ICD-10-CM | POA: Diagnosis not present

## 2018-01-11 LAB — CBC WITH DIFFERENTIAL/PLATELET
Basophils Absolute: 0.1 10*3/uL (ref 0.0–0.1)
Basophils Relative: 0.9 % (ref 0.0–3.0)
Eosinophils Absolute: 0.1 10*3/uL (ref 0.0–0.7)
Eosinophils Relative: 1.2 % (ref 0.0–5.0)
HCT: 37.4 % (ref 36.0–46.0)
Hemoglobin: 12.7 g/dL (ref 12.0–15.0)
Lymphocytes Relative: 18 % (ref 12.0–46.0)
Lymphs Abs: 1.4 10*3/uL (ref 0.7–4.0)
MCHC: 33.8 g/dL (ref 30.0–36.0)
MCV: 89.9 fl (ref 78.0–100.0)
Monocytes Absolute: 0.5 10*3/uL (ref 0.1–1.0)
Monocytes Relative: 5.9 % (ref 3.0–12.0)
Neutro Abs: 5.9 10*3/uL (ref 1.4–7.7)
Neutrophils Relative %: 74 % (ref 43.0–77.0)
Platelets: 395 10*3/uL (ref 150.0–400.0)
RBC: 4.16 Mil/uL (ref 3.87–5.11)
RDW: 12.4 % (ref 11.5–15.5)
WBC: 8 10*3/uL (ref 4.0–10.5)

## 2018-01-11 LAB — LIPID PANEL
Cholesterol: 183 mg/dL (ref 0–200)
HDL: 64.7 mg/dL (ref >39.00)
LDL Cholesterol: 100 mg/dL — ABNORMAL HIGH (ref 0–99)
NonHDL: 118.19
Total CHOL/HDL Ratio: 3
Triglycerides: 89 mg/dL (ref 0.0–149.0)
VLDL: 17.8 mg/dL (ref 0.0–40.0)

## 2018-01-11 LAB — POC URINALSYSI DIPSTICK (AUTOMATED)
Bilirubin, UA: NEGATIVE
Glucose, UA: NEGATIVE
Ketones, UA: NEGATIVE
Leukocytes, UA: NEGATIVE
Nitrite, UA: NEGATIVE
Protein, UA: NEGATIVE
Spec Grav, UA: 1.015 (ref 1.010–1.025)
Urobilinogen, UA: 0.2 E.U./dL
pH, UA: 6 (ref 5.0–8.0)

## 2018-01-11 LAB — BASIC METABOLIC PANEL
BUN: 17 mg/dL (ref 6–23)
CO2: 29 mEq/L (ref 19–32)
Calcium: 9.4 mg/dL (ref 8.4–10.5)
Chloride: 103 mEq/L (ref 96–112)
Creatinine, Ser: 0.87 mg/dL (ref 0.40–1.20)
GFR: 68.61 mL/min (ref >60.00)
Glucose, Bld: 87 mg/dL (ref 70–99)
Potassium: 4.2 mEq/L (ref 3.5–5.1)
Sodium: 140 mEq/L (ref 135–145)

## 2018-01-11 LAB — HEPATIC FUNCTION PANEL
ALT: 11 U/L (ref 0–35)
AST: 14 U/L (ref 0–37)
Albumin: 4.1 g/dL (ref 3.5–5.2)
Alkaline Phosphatase: 110 U/L (ref 39–117)
Bilirubin, Direct: 0.1 mg/dL (ref 0.0–0.3)
Total Bilirubin: 0.4 mg/dL (ref 0.2–1.2)
Total Protein: 6.8 g/dL (ref 6.0–8.3)

## 2018-01-11 LAB — TSH: TSH: 0.5 u[IU]/mL (ref 0.35–4.50)

## 2018-01-11 LAB — VITAMIN D 25 HYDROXY (VIT D DEFICIENCY, FRACTURES): VITD: 34.02 ng/mL (ref 30.00–100.00)

## 2018-01-11 MED ORDER — ROSUVASTATIN CALCIUM 10 MG PO TABS: ORAL_TABLET | ORAL | 3 refills | Status: DC

## 2018-01-11 NOTE — Progress Notes (Signed)
   Subjective:    Patient ID: Stacy Gillespie, female    DOB: 12-Mar-1951, 67 y.o.   MRN: [id number]  HPI Here to follow up on issues. She feels fairly well. Her knee arthritis bothers her at times, but she is trying an aerobics class one day a week. She has floaters in the right eye, and she is seeing Dr. Kathlen Mody at [facility] for these. She sees Dr. Phineas Real for GYN care.    Review of Systems  Constitutional: Negative.   HENT: Negative.   Eyes: Positive for visual disturbance.  Respiratory: Negative.   Cardiovascular: Negative.   Gastrointestinal: Negative.   Genitourinary: Negative for decreased urine volume, difficulty urinating, dyspareunia, dysuria, enuresis, flank pain, frequency, hematuria, pelvic pain and urgency.  Musculoskeletal: Positive for arthralgias.  Skin: Negative.   Neurological: Negative.   Psychiatric/Behavioral: Negative.        Objective:   Physical Exam  Constitutional: She is oriented to person, place, and time. She appears well-developed and well-nourished. No distress.  HENT:  Head: Normocephalic and atraumatic.  Right Ear: External ear normal.  Left Ear: External ear normal.  Nose: Nose normal.  Mouth/Throat: Oropharynx is clear and moist. No oropharyngeal exudate.  Eyes: Pupils are equal, round, and reactive to light. Conjunctivae and EOM are normal. No scleral icterus.  Neck: Normal range of motion. Neck supple. No JVD present. No thyromegaly present.  Cardiovascular: Normal rate, regular rhythm, normal heart sounds and intact distal pulses. Exam reveals no gallop and no friction rub.  No murmur heard. Pulmonary/Chest: Effort normal and breath sounds normal. No respiratory distress. She has no wheezes. She has no rales. She exhibits no tenderness.  Abdominal: Soft. Bowel sounds are normal. She exhibits no distension and no mass. There is no tenderness. There is no rebound and no guarding.  Musculoskeletal: Normal range of motion. She  exhibits no edema or tenderness.  Lymphadenopathy:    She has no cervical adenopathy.  Neurological: She is alert and oriented to person, place, and time. She has normal reflexes. She displays normal reflexes. No cranial nerve deficit. She exhibits normal muscle tone. Coordination normal.  Skin: Skin is warm and dry. No rash noted. No erythema.  Psychiatric: She has a normal mood and affect. Her behavior is normal. Judgment and thought content normal.          Assessment & Plan:  She seems to be doing well. She will follow up with Ophthalmology and GYN. Get fasting labs today to check lipids and her thyroid level. Her gout and osteoarthritis are stable.  Alysia Penna, MD

## 2018-03-11 ENCOUNTER — Other Ambulatory Visit: Payer: Self-pay | Admitting: Gynecology

## 2018-03-11 DIAGNOSIS — Z1231 Encounter for screening mammogram for malignant neoplasm of breast: Secondary | ICD-10-CM

## 2018-04-18 ENCOUNTER — Ambulatory Visit
Admission: RE | Admit: 2018-04-18 | Discharge: 2018-04-18 | Disposition: A | Discharge status: Home / Self Care | Payer: Medicare Other | Source: Ambulatory Visit | Attending: Gynecology | Admitting: Gynecology

## 2018-04-18 DIAGNOSIS — Z1231 Encounter for screening mammogram for malignant neoplasm of breast: Secondary | ICD-10-CM

## 2018-04-19 ENCOUNTER — Other Ambulatory Visit: Payer: Self-pay | Admitting: Gynecology

## 2018-04-19 DIAGNOSIS — R921 Mammographic calcification found on diagnostic imaging of breast: Secondary | ICD-10-CM

## 2018-04-22 DIAGNOSIS — H5319 Other subjective visual disturbances: Secondary | ICD-10-CM | POA: Diagnosis not present

## 2018-04-22 DIAGNOSIS — H43811 Vitreous degeneration, right eye: Secondary | ICD-10-CM | POA: Diagnosis not present

## 2018-04-22 DIAGNOSIS — H2513 Age-related nuclear cataract, bilateral: Secondary | ICD-10-CM | POA: Diagnosis not present

## 2018-04-22 DIAGNOSIS — H35033 Hypertensive retinopathy, bilateral: Secondary | ICD-10-CM | POA: Diagnosis not present

## 2018-04-22 DIAGNOSIS — H25013 Cortical age-related cataract, bilateral: Secondary | ICD-10-CM | POA: Diagnosis not present

## 2018-04-29 ENCOUNTER — Other Ambulatory Visit: Payer: Medicare Other

## 2018-04-29 ENCOUNTER — Ambulatory Visit: Admission: RE | Admit: 2018-04-29 | Payer: Medicare Other | Source: Ambulatory Visit

## 2018-04-29 ENCOUNTER — Ambulatory Visit: Payer: Medicare Other

## 2018-04-29 ENCOUNTER — Ambulatory Visit
Admission: RE | Admit: 2018-04-29 | Discharge: 2018-04-29 | Disposition: A | Discharge status: Home / Self Care | Payer: Medicare Other | Source: Ambulatory Visit | Attending: Gynecology | Admitting: Gynecology

## 2018-04-29 DIAGNOSIS — R921 Mammographic calcification found on diagnostic imaging of breast: Secondary | ICD-10-CM | POA: Diagnosis not present

## 2018-05-10 ENCOUNTER — Ambulatory Visit (INDEPENDENT_AMBULATORY_CARE_PROVIDER_SITE_OTHER): Payer: Medicare Other

## 2018-05-10 DIAGNOSIS — Z23 Encounter for immunization: Secondary | ICD-10-CM | POA: Diagnosis not present

## 2018-06-13 DIAGNOSIS — M79671 Pain in right foot: Secondary | ICD-10-CM | POA: Diagnosis not present

## 2018-07-01 ENCOUNTER — Encounter: Payer: Self-pay | Admitting: Gynecology

## 2018-07-01 ENCOUNTER — Ambulatory Visit (INDEPENDENT_AMBULATORY_CARE_PROVIDER_SITE_OTHER): Payer: Medicare Other | Admitting: Gynecology

## 2018-07-01 VITALS — BP 120/78 | Ht 66.0 in | Wt 155.0 lb

## 2018-07-01 DIAGNOSIS — N952 Postmenopausal atrophic vaginitis: Secondary | ICD-10-CM

## 2018-07-01 DIAGNOSIS — Z01419 Encounter for gynecological examination (general) (routine) without abnormal findings: Secondary | ICD-10-CM | POA: Diagnosis not present

## 2018-07-01 DIAGNOSIS — M8580 Other specified disorders of bone density and structure, unspecified site: Secondary | ICD-10-CM

## 2018-07-01 DIAGNOSIS — Z79890 Hormone replacement therapy (postmenopausal): Secondary | ICD-10-CM

## 2018-07-01 MED ORDER — CONJ ESTROG-MEDROXYPROGEST ACE 0.3-1.5 MG PO TABS: 1.0000 | ORAL_TABLET | Freq: Every day | ORAL | 4 refills | Status: DC

## 2018-07-01 NOTE — Progress Notes (Signed)
    Stacy Gillespie 11-Feb-1951 [id number]        67 y.o.  G0P0000 for breast and pelvic exam.  Several issues noted below  Past medical history,surgical history, problem list, medications, allergies, family history and social history were all reviewed and documented as reviewed in the EPIC chart.  ROS:  Performed with pertinent positives and negatives included in the history, assessment and plan.   Additional significant findings : None   Exam: [name] assistant Vitals:   07/01/18 1610  BP: 120/78  Weight: 155 lb (70.3 kg)  Height: 5\' 6"  (1.676 m)   Body mass index is 25.02 kg/m.  General appearance:  Normal affect, orientation and appearance. Skin: Grossly normal HEENT: Without gross lesions.  No cervical or supraclavicular adenopathy. Thyroid normal.  Lungs:  Clear without wheezing, rales or rhonchi Cardiac: RR, without RMG Abdominal:  Soft, nontender, without masses, guarding, rebound, organomegaly or hernia Breasts:  Examined lying and sitting without masses, retractions, discharge or axillary adenopathy. Pelvic:  Ext, BUS, Vagina: Normal with atrophic changes  Cervix: Normal with atrophic changes  Uterus: Anteverted, normal size, shape and contour, midline and mobile nontender   Adnexa: Without masses or tenderness    Anus and perineum: Normal   Rectovaginal: Normal sphincter tone without palpated masses or tenderness.    Assessment/Plan:  67 y.o. G0P0000 female for breast and pelvic exam.   1. Continues on Prempro 0.3/1.5.  Taking it every other day.  No bleeding.  History of significant menopausal symptoms when she tries to wean off of it completely.  We again discussed the whole issue of HRT to include the WHI study and increased risk of stroke heart attack DVT and breast cancer.  Benefits to include symptom relief as well as possible cardiovascular and bone health when started early also reviewed.  At this point the patient strongly wants to continue and I  refilled her x1 year.  I gave her written prescription at her request. 2. Osteopenia.  DEXA 2017 T score -1.4 FRAX 8% / 1%.  Recently broke a bone in her foot.  Check repeat DEXA now.  Vitamin D level in the 30 range earlier this year and she is taking extra vitamin D. 3. Mammography 04/2018.  Repeat next year.  Breast exam normal today. 4. Colonoscopy 2013.  Repeat at their recommended interval. 5. Pap smear/HPV 2015.  No Pap smear done today.  No history of abnormal Pap smears.  Options to stop screening per current screening guidelines reviewed and she is comfortable with this. 6. Health maintenance.  No routine lab work done as patient does this elsewhere.  Follow-up 1 year, sooner as needed.   Anastasio Auerbach MD, 4:52 PM 07/01/2018

## 2018-07-01 NOTE — Patient Instructions (Signed)
Follow-up for the bone density as scheduled. 

## 2018-07-04 ENCOUNTER — Other Ambulatory Visit: Payer: Self-pay

## 2018-07-04 DIAGNOSIS — M79671 Pain in right foot: Secondary | ICD-10-CM | POA: Diagnosis not present

## 2018-07-04 MED ORDER — CONJ ESTROG-MEDROXYPROGEST ACE 0.3-1.5 MG PO TABS: 1.0000 | ORAL_TABLET | Freq: Every day | ORAL | 4 refills | Status: DC

## 2018-08-01 DIAGNOSIS — M79671 Pain in right foot: Secondary | ICD-10-CM | POA: Diagnosis not present

## 2018-08-05 ENCOUNTER — Encounter: Payer: Self-pay | Admitting: Gynecology

## 2018-08-05 ENCOUNTER — Ambulatory Visit (INDEPENDENT_AMBULATORY_CARE_PROVIDER_SITE_OTHER): Payer: Medicare Other

## 2018-08-05 DIAGNOSIS — M8589 Other specified disorders of bone density and structure, multiple sites: Secondary | ICD-10-CM | POA: Diagnosis not present

## 2018-08-05 DIAGNOSIS — M8580 Other specified disorders of bone density and structure, unspecified site: Secondary | ICD-10-CM

## 2018-08-05 DIAGNOSIS — Z780 Asymptomatic menopausal state: Secondary | ICD-10-CM | POA: Diagnosis not present

## 2018-08-06 ENCOUNTER — Other Ambulatory Visit: Payer: Self-pay | Admitting: Gynecology

## 2018-08-06 DIAGNOSIS — Z780 Asymptomatic menopausal state: Secondary | ICD-10-CM

## 2018-08-06 DIAGNOSIS — M8589 Other specified disorders of bone density and structure, multiple sites: Secondary | ICD-10-CM

## 2018-09-26 DIAGNOSIS — M25572 Pain in left ankle and joints of left foot: Secondary | ICD-10-CM | POA: Diagnosis not present

## 2018-09-30 DIAGNOSIS — M25572 Pain in left ankle and joints of left foot: Secondary | ICD-10-CM | POA: Diagnosis not present

## 2018-09-30 DIAGNOSIS — M25672 Stiffness of left ankle, not elsewhere classified: Secondary | ICD-10-CM | POA: Diagnosis not present

## 2018-11-28 ENCOUNTER — Other Ambulatory Visit: Payer: Self-pay | Admitting: Family Medicine

## 2018-12-27 ENCOUNTER — Telehealth: Payer: Self-pay | Admitting: Family Medicine

## 2018-12-27 NOTE — Telephone Encounter (Signed)
Pt would like to have a call to let her know if her medication will be called in.

## 2018-12-27 NOTE — Telephone Encounter (Signed)
Pt sent a msg in stating that she will be out of her Rx's (VOLTAREN AND SYNTHROID) in two weeks she has a CPE scheduled January 21, 2019 at 9:30.

## 2019-01-10 MED ORDER — DICLOFENAC SODIUM 75 MG PO TBEC: 75.0000 mg | DELAYED_RELEASE_TABLET | Freq: Every day | ORAL | 0 refills | Status: DC

## 2019-01-10 MED ORDER — LEVOTHYROXINE SODIUM 100 MCG PO TABS: 100.0000 ug | ORAL_TABLET | Freq: Every day | ORAL | 0 refills | Status: DC

## 2019-01-10 NOTE — Telephone Encounter (Signed)
Refills sent to the local pharmacy for the pt.

## 2019-01-21 ENCOUNTER — Ambulatory Visit (INDEPENDENT_AMBULATORY_CARE_PROVIDER_SITE_OTHER): Payer: Medicare Other | Admitting: Family Medicine

## 2019-01-21 ENCOUNTER — Ambulatory Visit (INDEPENDENT_AMBULATORY_CARE_PROVIDER_SITE_OTHER): Payer: Medicare Other

## 2019-01-21 ENCOUNTER — Encounter: Payer: Self-pay | Admitting: Family Medicine

## 2019-01-21 ENCOUNTER — Other Ambulatory Visit: Payer: Self-pay

## 2019-01-21 VITALS — BP 110/60 | Temp 98.0°F | Ht 66.5 in | Wt 153.0 lb

## 2019-01-21 DIAGNOSIS — E559 Vitamin D deficiency, unspecified: Secondary | ICD-10-CM

## 2019-01-21 DIAGNOSIS — M129 Arthropathy, unspecified: Secondary | ICD-10-CM

## 2019-01-21 DIAGNOSIS — E039 Hypothyroidism, unspecified: Secondary | ICD-10-CM | POA: Diagnosis not present

## 2019-01-21 DIAGNOSIS — R300 Dysuria: Secondary | ICD-10-CM

## 2019-01-21 DIAGNOSIS — J439 Emphysema, unspecified: Secondary | ICD-10-CM

## 2019-01-21 DIAGNOSIS — Z23 Encounter for immunization: Secondary | ICD-10-CM | POA: Diagnosis not present

## 2019-01-21 DIAGNOSIS — E782 Mixed hyperlipidemia: Secondary | ICD-10-CM

## 2019-01-21 DIAGNOSIS — F17200 Nicotine dependence, unspecified, uncomplicated: Secondary | ICD-10-CM | POA: Diagnosis not present

## 2019-01-21 LAB — HEPATIC FUNCTION PANEL
ALT: 9 U/L (ref 0–35)
AST: 14 U/L (ref 0–37)
Albumin: 4.1 g/dL (ref 3.5–5.2)
Alkaline Phosphatase: 109 U/L (ref 39–117)
Bilirubin, Direct: 0.1 mg/dL (ref 0.0–0.3)
Total Bilirubin: 0.4 mg/dL (ref 0.2–1.2)
Total Protein: 6.8 g/dL (ref 6.0–8.3)

## 2019-01-21 LAB — POC URINALSYSI DIPSTICK (AUTOMATED)
Bilirubin, UA: NEGATIVE
Glucose, UA: NEGATIVE
Ketones, UA: NEGATIVE
Nitrite, UA: NEGATIVE
Protein, UA: POSITIVE — AB
Spec Grav, UA: 1.02 (ref 1.010–1.025)
Urobilinogen, UA: 0.2 E.U./dL
pH, UA: 6 (ref 5.0–8.0)

## 2019-01-21 LAB — BASIC METABOLIC PANEL
BUN: 19 mg/dL (ref 6–23)
CO2: 29 mEq/L (ref 19–32)
Calcium: 9.2 mg/dL (ref 8.4–10.5)
Chloride: 101 mEq/L (ref 96–112)
Creatinine, Ser: 0.95 mg/dL (ref 0.40–1.20)
GFR: 58.15 mL/min — ABNORMAL LOW (ref >60.00)
Glucose, Bld: 77 mg/dL (ref 70–99)
Potassium: 5 mEq/L (ref 3.5–5.1)
Sodium: 138 mEq/L (ref 135–145)

## 2019-01-21 LAB — CBC WITH DIFFERENTIAL/PLATELET
Basophils Absolute: 0.1 10*3/uL (ref 0.0–0.1)
Basophils Relative: 0.9 % (ref 0.0–3.0)
Eosinophils Absolute: 0.1 10*3/uL (ref 0.0–0.7)
Eosinophils Relative: 1.1 % (ref 0.0–5.0)
HCT: 37.6 % (ref 36.0–46.0)
Hemoglobin: 12.6 g/dL (ref 12.0–15.0)
Lymphocytes Relative: 16.2 % (ref 12.0–46.0)
Lymphs Abs: 1.5 10*3/uL (ref 0.7–4.0)
MCHC: 33.6 g/dL (ref 30.0–36.0)
MCV: 91.5 fl (ref 78.0–100.0)
Monocytes Absolute: 0.6 10*3/uL (ref 0.1–1.0)
Monocytes Relative: 7.1 % (ref 3.0–12.0)
Neutro Abs: 6.8 10*3/uL (ref 1.4–7.7)
Neutrophils Relative %: 74.7 % (ref 43.0–77.0)
Platelets: 382 10*3/uL (ref 150.0–400.0)
RBC: 4.11 Mil/uL (ref 3.87–5.11)
RDW: 12.8 % (ref 11.5–15.5)
WBC: 9.1 10*3/uL (ref 4.0–10.5)

## 2019-01-21 LAB — LIPID PANEL
Cholesterol: 173 mg/dL (ref 0–200)
HDL: 66.6 mg/dL (ref >39.00)
LDL Cholesterol: 89 mg/dL (ref 0–99)
NonHDL: 105.96
Total CHOL/HDL Ratio: 3
Triglycerides: 84 mg/dL (ref 0.0–149.0)
VLDL: 16.8 mg/dL (ref 0.0–40.0)

## 2019-01-21 LAB — VITAMIN D 25 HYDROXY (VIT D DEFICIENCY, FRACTURES): VITD: 47.08 ng/mL (ref 30.00–100.00)

## 2019-01-21 LAB — TSH: TSH: 0.75 u[IU]/mL (ref 0.35–4.50)

## 2019-01-21 MED ORDER — DICLOFENAC SODIUM 75 MG PO TBEC: 75.0000 mg | DELAYED_RELEASE_TABLET | Freq: Two times a day (BID) | ORAL | 3 refills | Status: DC

## 2019-01-21 MED ORDER — LEVOTHYROXINE SODIUM 100 MCG PO TABS: 100.0000 ug | ORAL_TABLET | Freq: Every day | ORAL | 3 refills | Status: DC

## 2019-01-21 MED ORDER — TRIAMCINOLONE ACETONIDE 55 MCG/ACT NA AERO: 2.0000 | INHALATION_SPRAY | Freq: Every day | NASAL | 3 refills | Status: AC

## 2019-01-21 MED ORDER — ROSUVASTATIN CALCIUM 10 MG PO TABS: ORAL_TABLET | ORAL | 3 refills | Status: DC

## 2019-01-21 NOTE — Progress Notes (Signed)
   Subjective:    Patient ID: Stacy Gillespie, female    DOB: October 14, 1950, 67 y.o.   MRN: [id number]  HPI Here to follow up on issues. She feels well except for her joint pains. Diclofenac helps. She walks for exercise. She has mild SOB on exertion. Her last CXR was in 2018.    Review of Systems  Constitutional: Negative.   HENT: Negative.   Eyes: Negative.   Respiratory: Positive for shortness of breath. Negative for cough, choking, chest tightness and wheezing.   Cardiovascular: Negative.   Gastrointestinal: Negative.   Genitourinary: Negative for decreased urine volume, difficulty urinating, dyspareunia, dysuria, enuresis, flank pain, frequency, hematuria, pelvic pain and urgency.  Musculoskeletal: Positive for arthralgias.  Skin: Negative.   Neurological: Negative.   Psychiatric/Behavioral: Negative.        Objective:   Physical Exam Constitutional:      General: She is not in acute distress.    Appearance: She is well-developed.  HENT:     Head: Normocephalic and atraumatic.     Right Ear: External ear normal.     Left Ear: External ear normal.     Nose: Nose normal.     Mouth/Throat:     Pharynx: No oropharyngeal exudate.  Eyes:     General: No scleral icterus.    Conjunctiva/sclera: Conjunctivae normal.     Pupils: Pupils are equal, round, and reactive to light.  Neck:     Musculoskeletal: Normal range of motion and neck supple.     Thyroid: No thyromegaly.     Vascular: No JVD.  Cardiovascular:     Rate and Rhythm: Normal rate and regular rhythm.     Heart sounds: Normal heart sounds. No murmur. No friction rub. No gallop.   Pulmonary:     Effort: Pulmonary effort is normal. No respiratory distress.     Breath sounds: Normal breath sounds. No wheezing or rales.  Chest:     Chest wall: No tenderness.  Abdominal:     General: Bowel sounds are normal. There is no distension.     Palpations: Abdomen is soft. There is no mass.     Tenderness: There is no  abdominal tenderness. There is no guarding or rebound.  Musculoskeletal: Normal range of motion.        General: No tenderness.  Lymphadenopathy:     Cervical: No cervical adenopathy.  Skin:    General: Skin is warm and dry.     Findings: No erythema or rash.  Neurological:     Mental Status: She is alert and oriented to person, place, and time.     Cranial Nerves: No cranial nerve deficit.     Motor: No abnormal muscle tone.     Coordination: Coordination normal.     Deep Tendon Reflexes: Reflexes are normal and symmetric. Reflexes normal.  Psychiatric:        Behavior: Behavior normal.        Thought Content: Thought content normal.        Judgment: Judgment normal.           Assessment & Plan:  Her arthritis is stable. Her COPD is stable, we will get another CXR today. Given a Prevnar 13 vaccine. Check fasting labs for lipids, thyroid level, etc.  Alysia Penna, MD

## 2019-01-23 LAB — URINE CULTURE
MICRO NUMBER:: 622195
Result:: NO GROWTH
SPECIMEN QUALITY:: ADEQUATE

## 2019-02-24 ENCOUNTER — Other Ambulatory Visit: Payer: Self-pay

## 2019-04-14 ENCOUNTER — Other Ambulatory Visit: Payer: Self-pay | Admitting: Gynecology

## 2019-04-14 DIAGNOSIS — Z1231 Encounter for screening mammogram for malignant neoplasm of breast: Secondary | ICD-10-CM

## 2019-04-30 ENCOUNTER — Encounter: Payer: Self-pay | Admitting: Gynecology

## 2019-05-20 ENCOUNTER — Other Ambulatory Visit: Payer: Self-pay

## 2019-05-20 ENCOUNTER — Ambulatory Visit (INDEPENDENT_AMBULATORY_CARE_PROVIDER_SITE_OTHER): Payer: Medicare Other

## 2019-05-20 ENCOUNTER — Encounter: Payer: Self-pay | Admitting: Family Medicine

## 2019-05-20 DIAGNOSIS — Z23 Encounter for immunization: Secondary | ICD-10-CM

## 2019-05-30 ENCOUNTER — Ambulatory Visit
Admission: RE | Admit: 2019-05-30 | Discharge: 2019-05-30 | Disposition: A | Discharge status: Home / Self Care | Payer: Medicare Other | Source: Ambulatory Visit | Attending: Gynecology | Admitting: Gynecology

## 2019-05-30 ENCOUNTER — Other Ambulatory Visit: Payer: Self-pay

## 2019-05-30 DIAGNOSIS — Z1231 Encounter for screening mammogram for malignant neoplasm of breast: Secondary | ICD-10-CM

## 2019-06-18 ENCOUNTER — Other Ambulatory Visit: Payer: Self-pay

## 2019-07-21 ENCOUNTER — Other Ambulatory Visit: Payer: Self-pay

## 2019-07-22 ENCOUNTER — Ambulatory Visit (INDEPENDENT_AMBULATORY_CARE_PROVIDER_SITE_OTHER): Payer: Medicare Other | Admitting: Gynecology

## 2019-07-22 ENCOUNTER — Encounter: Payer: Self-pay | Admitting: Gynecology

## 2019-07-22 VITALS — BP 122/74 | Ht 66.25 in | Wt 154.4 lb

## 2019-07-22 DIAGNOSIS — Z79890 Hormone replacement therapy (postmenopausal): Secondary | ICD-10-CM

## 2019-07-22 DIAGNOSIS — Z01419 Encounter for gynecological examination (general) (routine) without abnormal findings: Secondary | ICD-10-CM

## 2019-07-22 DIAGNOSIS — M8580 Other specified disorders of bone density and structure, unspecified site: Secondary | ICD-10-CM

## 2019-07-22 DIAGNOSIS — N952 Postmenopausal atrophic vaginitis: Secondary | ICD-10-CM

## 2019-07-22 MED ORDER — CONJ ESTROG-MEDROXYPROGEST ACE 0.3-1.5 MG PO TABS: 1.0000 | ORAL_TABLET | Freq: Every day | ORAL | 4 refills | Status: DC

## 2019-07-22 NOTE — Patient Instructions (Signed)
Follow-up in 1 year for annual exam, sooner as needed. 

## 2019-07-22 NOTE — Progress Notes (Signed)
    Stacy Gillespie [birth year] [id number]        68 y.o.  G0P0000 for breast and pelvic exam.  Without gynecologic complaints.  Has tried weaning from her Prempro with unacceptable hot flushes and sweats  Past medical history,surgical history, problem list, medications, allergies, family history and social history were all reviewed and documented as reviewed in the EPIC chart.  ROS:  Performed with pertinent positives and negatives included in the history, assessment and plan.   Additional significant findings : None   Exam: Journalist, newspaper Vitals:   07/22/19 1409  BP: 122/74  Weight: 154 lb 6.4 oz (70 kg)  Height: 5' 6.25" (1.683 m)   Body mass index is 24.73 kg/m.  General appearance:  Normal affect, orientation and appearance. Skin: Grossly normal HEENT: Without gross lesions.  No cervical or supraclavicular adenopathy. Thyroid normal.  Lungs:  Clear without wheezing, rales or rhonchi Cardiac: RR, without RMG Abdominal:  Soft, nontender, without masses, guarding, rebound, organomegaly or hernia Breasts:  Examined lying and sitting without masses, retractions, discharge or axillary adenopathy. Pelvic:  Ext, BUS, Vagina: With atrophic changes  Cervix: With atrophic changes  Uterus: Anteverted, normal size, shape and contour, midline and mobile nontender   Adnexa: Without masses or tenderness    Anus and perineum: Normal   Rectovaginal: Normal sphincter tone without palpated masses or tenderness.    Assessment/Plan:  68 y.o. G0P0000 female for breast and pelvic exam  1. Postmenopausal/HRT.  Continues on Prempro 0.3/1.5.  No bleeding.  Has tried weaning with unacceptable hot flashes and sweats.  We again discussed the risks versus benefits to include increased risk of thrombosis such as stroke heart attack DVT and breast cancer.  Patient clearly understands and accepts the risks.  Refill x1 year provided.  Will call if any issues or bleeding. 2. Mammography 12/2018.  Continue  with annual mammography when due.  Breast exam normal today. 3. Pap smear/HPV 2015.  No Pap smear done today.  No history of significant abnormal Pap smears.  Reviewed current screening guidelines and we both agree to stop screening. 4. Colonoscopy 2013.  Repeat at their recommended interval. 5. Osteopenia.  DEXA 2020 T score -1.3.  Stable from prior DEXA.  Plan repeat DEXA at 2-year interval. 6. Health maintenance.  No routine lab work done as patient does this elsewhere.  Follow-up 1 year, sooner as needed.   Anastasio Auerbach MD, 2:31 PM 07/22/2019

## 2019-09-04 DIAGNOSIS — Z23 Encounter for immunization: Secondary | ICD-10-CM | POA: Diagnosis not present

## 2019-10-28 ENCOUNTER — Encounter: Payer: Self-pay | Admitting: Family Medicine

## 2019-10-28 DIAGNOSIS — H43391 Other vitreous opacities, right eye: Secondary | ICD-10-CM | POA: Diagnosis not present

## 2019-10-28 DIAGNOSIS — H2513 Age-related nuclear cataract, bilateral: Secondary | ICD-10-CM | POA: Diagnosis not present

## 2019-10-28 DIAGNOSIS — H25013 Cortical age-related cataract, bilateral: Secondary | ICD-10-CM | POA: Diagnosis not present

## 2019-10-28 DIAGNOSIS — H35033 Hypertensive retinopathy, bilateral: Secondary | ICD-10-CM | POA: Diagnosis not present

## 2020-01-04 ENCOUNTER — Other Ambulatory Visit: Payer: Self-pay | Admitting: Family Medicine

## 2020-01-22 ENCOUNTER — Other Ambulatory Visit: Payer: Self-pay

## 2020-01-22 ENCOUNTER — Ambulatory Visit (INDEPENDENT_AMBULATORY_CARE_PROVIDER_SITE_OTHER): Payer: Medicare Other | Admitting: Family Medicine

## 2020-01-22 ENCOUNTER — Encounter: Payer: Self-pay | Admitting: Family Medicine

## 2020-01-22 VITALS — BP 130/60 | HR 73 | Temp 97.2°F | Wt 154.4 lb

## 2020-01-22 DIAGNOSIS — M7061 Trochanteric bursitis, right hip: Secondary | ICD-10-CM | POA: Diagnosis not present

## 2020-01-22 DIAGNOSIS — E559 Vitamin D deficiency, unspecified: Secondary | ICD-10-CM | POA: Diagnosis not present

## 2020-01-22 DIAGNOSIS — E782 Mixed hyperlipidemia: Secondary | ICD-10-CM | POA: Diagnosis not present

## 2020-01-22 DIAGNOSIS — J449 Chronic obstructive pulmonary disease, unspecified: Secondary | ICD-10-CM

## 2020-01-22 DIAGNOSIS — M7062 Trochanteric bursitis, left hip: Secondary | ICD-10-CM

## 2020-01-22 DIAGNOSIS — E039 Hypothyroidism, unspecified: Secondary | ICD-10-CM

## 2020-01-22 LAB — CBC WITH DIFFERENTIAL/PLATELET
Basophils Absolute: 0.1 10*3/uL (ref 0.0–0.1)
Basophils Relative: 0.8 % (ref 0.0–3.0)
Eosinophils Absolute: 0.1 10*3/uL (ref 0.0–0.7)
Eosinophils Relative: 0.9 % (ref 0.0–5.0)
HCT: 36.5 % (ref 36.0–46.0)
Hemoglobin: 12.5 g/dL (ref 12.0–15.0)
Lymphocytes Relative: 14.3 % (ref 12.0–46.0)
Lymphs Abs: 1.3 10*3/uL (ref 0.7–4.0)
MCHC: 34.1 g/dL (ref 30.0–36.0)
MCV: 89.5 fl (ref 78.0–100.0)
Monocytes Absolute: 0.6 10*3/uL (ref 0.1–1.0)
Monocytes Relative: 6.7 % (ref 3.0–12.0)
Neutro Abs: 7.3 10*3/uL (ref 1.4–7.7)
Neutrophils Relative %: 77.3 % — ABNORMAL HIGH (ref 43.0–77.0)
Platelets: 384 10*3/uL (ref 150.0–400.0)
RBC: 4.08 Mil/uL (ref 3.87–5.11)
RDW: 12.7 % (ref 11.5–15.5)
WBC: 9.4 10*3/uL (ref 4.0–10.5)

## 2020-01-22 LAB — HEPATIC FUNCTION PANEL
ALT: 11 U/L (ref 0–35)
AST: 14 U/L (ref 0–37)
Albumin: 4.2 g/dL (ref 3.5–5.2)
Alkaline Phosphatase: 121 U/L — ABNORMAL HIGH (ref 39–117)
Bilirubin, Direct: 0.1 mg/dL (ref 0.0–0.3)
Total Bilirubin: 0.5 mg/dL (ref 0.2–1.2)
Total Protein: 6.9 g/dL (ref 6.0–8.3)

## 2020-01-22 LAB — BASIC METABOLIC PANEL
BUN: 19 mg/dL (ref 6–23)
CO2: 28 mEq/L (ref 19–32)
Calcium: 9.5 mg/dL (ref 8.4–10.5)
Chloride: 100 mEq/L (ref 96–112)
Creatinine, Ser: 1.01 mg/dL (ref 0.40–1.20)
GFR: 54.03 mL/min — ABNORMAL LOW (ref >60.00)
Glucose, Bld: 89 mg/dL (ref 70–99)
Potassium: 4.2 mEq/L (ref 3.5–5.1)
Sodium: 136 mEq/L (ref 135–145)

## 2020-01-22 LAB — VITAMIN D 25 HYDROXY (VIT D DEFICIENCY, FRACTURES): VITD: 38.29 ng/mL (ref 30.00–100.00)

## 2020-01-22 LAB — LIPID PANEL
Cholesterol: 181 mg/dL (ref 0–200)
HDL: 67.8 mg/dL (ref >39.00)
LDL Cholesterol: 93 mg/dL (ref 0–99)
NonHDL: 113.18
Total CHOL/HDL Ratio: 3
Triglycerides: 100 mg/dL (ref 0.0–149.0)
VLDL: 20 mg/dL (ref 0.0–40.0)

## 2020-01-22 LAB — T3, FREE: T3, Free: 3 pg/mL (ref 2.3–4.2)

## 2020-01-22 LAB — T4, FREE: Free T4: 1.44 ng/dL (ref 0.60–1.60)

## 2020-01-22 LAB — TSH: TSH: 1.27 u[IU]/mL (ref 0.35–4.50)

## 2020-01-22 MED ORDER — ROSUVASTATIN CALCIUM 10 MG PO TABS: ORAL_TABLET | ORAL | 3 refills | Status: DC

## 2020-01-22 MED ORDER — LEVOTHYROXINE SODIUM 100 MCG PO TABS: 100.0000 ug | ORAL_TABLET | Freq: Every day | ORAL | 3 refills | Status: DC

## 2020-01-22 MED ORDER — DICLOFENAC SODIUM 75 MG PO TBEC: 75.0000 mg | DELAYED_RELEASE_TABLET | Freq: Two times a day (BID) | ORAL | 3 refills | Status: DC

## 2020-01-22 NOTE — Progress Notes (Signed)
Subjective:    Patient ID: Stacy Gillespie, female    DOB: [birth year], 69 y.o.   MRN: [id number]  HPI Here to follow up on issues. Her main concern is 3 months of sharp pains in both lateral hips. The pain radiates down into both lateral thighs. No back pain. No numbness or tingling in the feet. No recent trauma. She takes Diclofenac daily. Otherwise she has been doing well    Review of Systems  Constitutional: Negative.   HENT: Negative.   Eyes: Negative.   Respiratory: Negative.   Cardiovascular: Negative.   Gastrointestinal: Negative.   Genitourinary: Negative for decreased urine volume, difficulty urinating, dyspareunia, dysuria, enuresis, flank pain, frequency, hematuria, pelvic pain and urgency.  Musculoskeletal: Positive for arthralgias.  Skin: Negative.   Neurological: Negative.   Psychiatric/Behavioral: Negative.        Objective:   Physical Exam Constitutional:      General: She is not in acute distress.    Appearance: She is well-developed.  HENT:     Head: Normocephalic and atraumatic.     Right Ear: External ear normal.     Left Ear: External ear normal.     Nose: Nose normal.     Mouth/Throat:     Pharynx: No oropharyngeal exudate.  Eyes:     General: No scleral icterus.    Conjunctiva/sclera: Conjunctivae normal.     Pupils: Pupils are equal, round, and reactive to light.  Neck:     Thyroid: No thyromegaly.     Vascular: No JVD.  Cardiovascular:     Rate and Rhythm: Normal rate and regular rhythm.     Heart sounds: Normal heart sounds. No murmur heard.  No friction rub. No gallop.   Pulmonary:     Effort: Pulmonary effort is normal. No respiratory distress.     Breath sounds: Normal breath sounds. No wheezing or rales.  Chest:     Chest wall: No tenderness.  Abdominal:     General: Bowel sounds are normal. There is no distension.     Palpations: Abdomen is soft. There is no mass.     Tenderness: There is no abdominal tenderness. There is no  guarding or rebound.  Musculoskeletal:        General: Normal range of motion.     Cervical back: Normal range of motion and neck supple.     Comments: She is tender over both greater trochanters. Both hips show full ROM   Lymphadenopathy:     Cervical: No cervical adenopathy.  Skin:    General: Skin is warm and dry.     Findings: No erythema or rash.  Neurological:     Mental Status: She is alert and oriented to person, place, and time.     Cranial Nerves: No cranial nerve deficit.     Motor: No abnormal muscle tone.     Coordination: Coordination normal.     Deep Tendon Reflexes: Reflexes are normal and symmetric. Reflexes normal.  Psychiatric:        Behavior: Behavior normal.        Thought Content: Thought content normal.        Judgment: Judgment normal.           Assessment & Plan:  She has bilateral trochanteric bursitis. She will continue on Diclofenac and she will also ice the areas for 20 minutes TID. If this does not help we will refer her for steroid injections. Get fasting labs to check lipids, etc.  Check the thyroid levels. Her last CXR was last year, so we will get another one next year.  Alysia Penna, MD

## 2020-01-28 ENCOUNTER — Telehealth: Payer: Self-pay | Admitting: Family Medicine

## 2020-01-28 NOTE — Telephone Encounter (Signed)
Spoke with patient and she declined AWVS

## 2020-03-11 DIAGNOSIS — M7062 Trochanteric bursitis, left hip: Secondary | ICD-10-CM | POA: Diagnosis not present

## 2020-03-11 DIAGNOSIS — M7061 Trochanteric bursitis, right hip: Secondary | ICD-10-CM | POA: Diagnosis not present

## 2020-03-11 DIAGNOSIS — M25551 Pain in right hip: Secondary | ICD-10-CM | POA: Diagnosis not present

## 2020-03-11 DIAGNOSIS — M25552 Pain in left hip: Secondary | ICD-10-CM | POA: Diagnosis not present

## 2020-03-17 DIAGNOSIS — M7062 Trochanteric bursitis, left hip: Secondary | ICD-10-CM | POA: Diagnosis not present

## 2020-03-17 DIAGNOSIS — M7061 Trochanteric bursitis, right hip: Secondary | ICD-10-CM | POA: Diagnosis not present

## 2020-03-19 DIAGNOSIS — M7061 Trochanteric bursitis, right hip: Secondary | ICD-10-CM | POA: Diagnosis not present

## 2020-03-19 DIAGNOSIS — M7062 Trochanteric bursitis, left hip: Secondary | ICD-10-CM | POA: Diagnosis not present

## 2020-03-23 DIAGNOSIS — M7062 Trochanteric bursitis, left hip: Secondary | ICD-10-CM | POA: Diagnosis not present

## 2020-03-23 DIAGNOSIS — M7061 Trochanteric bursitis, right hip: Secondary | ICD-10-CM | POA: Diagnosis not present

## 2020-03-25 DIAGNOSIS — M7061 Trochanteric bursitis, right hip: Secondary | ICD-10-CM | POA: Diagnosis not present

## 2020-03-25 DIAGNOSIS — M7062 Trochanteric bursitis, left hip: Secondary | ICD-10-CM | POA: Diagnosis not present

## 2020-03-31 DIAGNOSIS — M7062 Trochanteric bursitis, left hip: Secondary | ICD-10-CM | POA: Diagnosis not present

## 2020-03-31 DIAGNOSIS — M7061 Trochanteric bursitis, right hip: Secondary | ICD-10-CM | POA: Diagnosis not present

## 2020-04-02 DIAGNOSIS — M7061 Trochanteric bursitis, right hip: Secondary | ICD-10-CM | POA: Diagnosis not present

## 2020-04-02 DIAGNOSIS — M7062 Trochanteric bursitis, left hip: Secondary | ICD-10-CM | POA: Diagnosis not present

## 2020-04-06 DIAGNOSIS — M7062 Trochanteric bursitis, left hip: Secondary | ICD-10-CM | POA: Diagnosis not present

## 2020-04-06 DIAGNOSIS — M7061 Trochanteric bursitis, right hip: Secondary | ICD-10-CM | POA: Diagnosis not present

## 2020-04-08 DIAGNOSIS — M7062 Trochanteric bursitis, left hip: Secondary | ICD-10-CM | POA: Diagnosis not present

## 2020-04-08 DIAGNOSIS — M7061 Trochanteric bursitis, right hip: Secondary | ICD-10-CM | POA: Diagnosis not present

## 2020-04-13 DIAGNOSIS — M25551 Pain in right hip: Secondary | ICD-10-CM | POA: Diagnosis not present

## 2020-04-13 DIAGNOSIS — M7062 Trochanteric bursitis, left hip: Secondary | ICD-10-CM | POA: Diagnosis not present

## 2020-04-13 DIAGNOSIS — M7061 Trochanteric bursitis, right hip: Secondary | ICD-10-CM | POA: Diagnosis not present

## 2020-04-13 DIAGNOSIS — M25552 Pain in left hip: Secondary | ICD-10-CM | POA: Diagnosis not present

## 2020-04-13 DIAGNOSIS — M545 Low back pain: Secondary | ICD-10-CM | POA: Diagnosis not present

## 2020-04-13 DIAGNOSIS — M5441 Lumbago with sciatica, right side: Secondary | ICD-10-CM | POA: Diagnosis not present

## 2020-04-15 DIAGNOSIS — M7062 Trochanteric bursitis, left hip: Secondary | ICD-10-CM | POA: Diagnosis not present

## 2020-04-15 DIAGNOSIS — M5136 Other intervertebral disc degeneration, lumbar region: Secondary | ICD-10-CM | POA: Diagnosis not present

## 2020-04-15 DIAGNOSIS — M7061 Trochanteric bursitis, right hip: Secondary | ICD-10-CM | POA: Diagnosis not present

## 2020-04-20 DIAGNOSIS — M7061 Trochanteric bursitis, right hip: Secondary | ICD-10-CM | POA: Diagnosis not present

## 2020-04-20 DIAGNOSIS — M7062 Trochanteric bursitis, left hip: Secondary | ICD-10-CM | POA: Diagnosis not present

## 2020-04-20 DIAGNOSIS — M5136 Other intervertebral disc degeneration, lumbar region: Secondary | ICD-10-CM | POA: Diagnosis not present

## 2020-04-22 DIAGNOSIS — M7062 Trochanteric bursitis, left hip: Secondary | ICD-10-CM | POA: Diagnosis not present

## 2020-04-22 DIAGNOSIS — M7061 Trochanteric bursitis, right hip: Secondary | ICD-10-CM | POA: Diagnosis not present

## 2020-04-22 DIAGNOSIS — M5136 Other intervertebral disc degeneration, lumbar region: Secondary | ICD-10-CM | POA: Diagnosis not present

## 2020-04-27 DIAGNOSIS — M5136 Other intervertebral disc degeneration, lumbar region: Secondary | ICD-10-CM | POA: Diagnosis not present

## 2020-04-27 DIAGNOSIS — M7062 Trochanteric bursitis, left hip: Secondary | ICD-10-CM | POA: Diagnosis not present

## 2020-04-27 DIAGNOSIS — M7061 Trochanteric bursitis, right hip: Secondary | ICD-10-CM | POA: Diagnosis not present

## 2020-04-28 ENCOUNTER — Telehealth: Payer: Self-pay | Admitting: Family Medicine

## 2020-04-28 NOTE — Progress Notes (Signed)
  Chronic Care Management   Outreach Note  04/28/2020 Name: Stacy Gillespie MRN: [id number] DOB: [birth year]  Referred by: Laurey Morale, MD Reason for referral : No chief complaint on file.   An unsuccessful telephone outreach was attempted today. The patient was referred to the pharmacist for assistance with care management and care coordination.   Follow Up Plan:   Carley Perdue [employer] Scheduler

## 2020-04-29 DIAGNOSIS — M5136 Other intervertebral disc degeneration, lumbar region: Secondary | ICD-10-CM | POA: Diagnosis not present

## 2020-04-29 DIAGNOSIS — M7062 Trochanteric bursitis, left hip: Secondary | ICD-10-CM | POA: Diagnosis not present

## 2020-04-29 DIAGNOSIS — M7061 Trochanteric bursitis, right hip: Secondary | ICD-10-CM | POA: Diagnosis not present

## 2020-04-29 DIAGNOSIS — Z23 Encounter for immunization: Secondary | ICD-10-CM | POA: Diagnosis not present

## 2020-05-03 ENCOUNTER — Other Ambulatory Visit: Payer: Self-pay | Admitting: Family Medicine

## 2020-05-03 DIAGNOSIS — Z1231 Encounter for screening mammogram for malignant neoplasm of breast: Secondary | ICD-10-CM

## 2020-05-04 DIAGNOSIS — M7062 Trochanteric bursitis, left hip: Secondary | ICD-10-CM | POA: Diagnosis not present

## 2020-05-04 DIAGNOSIS — M7061 Trochanteric bursitis, right hip: Secondary | ICD-10-CM | POA: Diagnosis not present

## 2020-05-04 DIAGNOSIS — M5136 Other intervertebral disc degeneration, lumbar region: Secondary | ICD-10-CM | POA: Diagnosis not present

## 2020-05-06 DIAGNOSIS — M7062 Trochanteric bursitis, left hip: Secondary | ICD-10-CM | POA: Diagnosis not present

## 2020-05-06 DIAGNOSIS — M5136 Other intervertebral disc degeneration, lumbar region: Secondary | ICD-10-CM | POA: Diagnosis not present

## 2020-05-06 DIAGNOSIS — M7061 Trochanteric bursitis, right hip: Secondary | ICD-10-CM | POA: Diagnosis not present

## 2020-05-10 DIAGNOSIS — M7061 Trochanteric bursitis, right hip: Secondary | ICD-10-CM | POA: Diagnosis not present

## 2020-05-10 DIAGNOSIS — M5136 Other intervertebral disc degeneration, lumbar region: Secondary | ICD-10-CM | POA: Diagnosis not present

## 2020-05-10 DIAGNOSIS — M7062 Trochanteric bursitis, left hip: Secondary | ICD-10-CM | POA: Diagnosis not present

## 2020-05-11 DIAGNOSIS — M25552 Pain in left hip: Secondary | ICD-10-CM | POA: Diagnosis not present

## 2020-05-11 DIAGNOSIS — M25551 Pain in right hip: Secondary | ICD-10-CM | POA: Diagnosis not present

## 2020-05-20 DIAGNOSIS — M7061 Trochanteric bursitis, right hip: Secondary | ICD-10-CM | POA: Diagnosis not present

## 2020-05-20 DIAGNOSIS — M7062 Trochanteric bursitis, left hip: Secondary | ICD-10-CM | POA: Diagnosis not present

## 2020-05-20 DIAGNOSIS — M5136 Other intervertebral disc degeneration, lumbar region: Secondary | ICD-10-CM | POA: Diagnosis not present

## 2020-05-26 DIAGNOSIS — M7061 Trochanteric bursitis, right hip: Secondary | ICD-10-CM | POA: Diagnosis not present

## 2020-05-26 DIAGNOSIS — M7062 Trochanteric bursitis, left hip: Secondary | ICD-10-CM | POA: Diagnosis not present

## 2020-05-26 DIAGNOSIS — M5136 Other intervertebral disc degeneration, lumbar region: Secondary | ICD-10-CM | POA: Diagnosis not present

## 2020-05-31 ENCOUNTER — Ambulatory Visit
Admission: RE | Admit: 2020-05-31 | Discharge: 2020-05-31 | Disposition: A | Discharge status: Home / Self Care | Payer: Medicare Other | Source: Ambulatory Visit | Attending: Family Medicine | Admitting: Family Medicine

## 2020-05-31 ENCOUNTER — Other Ambulatory Visit: Payer: Self-pay

## 2020-05-31 DIAGNOSIS — Z1231 Encounter for screening mammogram for malignant neoplasm of breast: Secondary | ICD-10-CM | POA: Diagnosis not present

## 2020-06-04 DIAGNOSIS — Z23 Encounter for immunization: Secondary | ICD-10-CM | POA: Diagnosis not present

## 2020-06-08 DIAGNOSIS — M7061 Trochanteric bursitis, right hip: Secondary | ICD-10-CM | POA: Diagnosis not present

## 2020-06-08 DIAGNOSIS — M25572 Pain in left ankle and joints of left foot: Secondary | ICD-10-CM | POA: Diagnosis not present

## 2020-06-08 DIAGNOSIS — M7062 Trochanteric bursitis, left hip: Secondary | ICD-10-CM | POA: Diagnosis not present

## 2020-06-12 DIAGNOSIS — M25572 Pain in left ankle and joints of left foot: Secondary | ICD-10-CM | POA: Diagnosis not present

## 2020-06-24 DIAGNOSIS — M25572 Pain in left ankle and joints of left foot: Secondary | ICD-10-CM | POA: Diagnosis not present

## 2020-07-06 DIAGNOSIS — M19072 Primary osteoarthritis, left ankle and foot: Secondary | ICD-10-CM | POA: Diagnosis not present

## 2020-07-06 DIAGNOSIS — M25672 Stiffness of left ankle, not elsewhere classified: Secondary | ICD-10-CM | POA: Diagnosis not present

## 2020-07-19 ENCOUNTER — Other Ambulatory Visit: Payer: Self-pay | Admitting: *Deleted

## 2020-07-19 MED ORDER — CONJ ESTROG-MEDROXYPROGEST ACE 0.3-1.5 MG PO TABS
1.0000 | ORAL_TABLET | Freq: Every day | ORAL | 0 refills | Status: DC
Start: 2020-07-19 — End: 2021-09-12

## 2020-07-22 ENCOUNTER — Encounter: Payer: Medicare Other | Admitting: Obstetrics and Gynecology

## 2020-07-26 ENCOUNTER — Encounter: Payer: Medicare Other | Admitting: Obstetrics and Gynecology

## 2020-09-03 DIAGNOSIS — M2022 Hallux rigidus, left foot: Secondary | ICD-10-CM | POA: Diagnosis not present

## 2020-09-03 DIAGNOSIS — M25572 Pain in left ankle and joints of left foot: Secondary | ICD-10-CM | POA: Diagnosis not present

## 2020-09-03 DIAGNOSIS — M19071 Primary osteoarthritis, right ankle and foot: Secondary | ICD-10-CM | POA: Diagnosis not present

## 2020-09-17 ENCOUNTER — Telehealth: Payer: Self-pay | Admitting: Family Medicine

## 2020-09-17 NOTE — Progress Notes (Signed)
  Chronic Care Management   Outreach Note  09/17/2020 Name: Stacy Gillespie MRN: [id number] DOB: 05-28-1951  Referred by: Laurey Morale, MD Reason for referral : No chief complaint on file.   A second unsuccessful telephone outreach was attempted today. The patient was referred to pharmacist for assistance with care management and care coordination.  Follow Up Plan:   Carley Perdue [employer] Scheduler

## 2020-10-01 ENCOUNTER — Telehealth: Payer: Self-pay | Admitting: Family Medicine

## 2020-10-01 NOTE — Progress Notes (Signed)
  Chronic Care Management   Outreach Note  10/01/2020 Name: SHERHONDA GASPAR MRN: [id number] DOB: [birth year]  Referred by: Laurey Morale, MD Reason for referral : No chief complaint on file.   An unsuccessful telephone outreach was attempted today. The patient was referred to the pharmacist for assistance with care management and care coordination.   Follow Up Plan:   Carley Perdue [employer] Scheduler

## 2020-10-31 ENCOUNTER — Other Ambulatory Visit: Payer: Self-pay | Admitting: Family Medicine

## 2020-11-01 NOTE — Telephone Encounter (Signed)
Pt needs appointment for further refills 

## 2020-12-31 ENCOUNTER — Telehealth: Payer: Self-pay | Admitting: Family Medicine

## 2020-12-31 NOTE — Telephone Encounter (Signed)
Left message for patient to call back and schedule Medicare Annual Wellness Visit (AWV) either virtually or in office.   Last AWV 01/05/14 please schedule at anytime with [facility] Nurse Health Advisor 1 or 2   This should be a 45 minute visit.

## 2020-12-31 NOTE — Telephone Encounter (Signed)
Pt called and stated that she is not interested in doing this kind of visit and she has a CPE scheduled with Dr. Sarajane Jews.

## 2021-01-22 DIAGNOSIS — Z20822 Contact with and (suspected) exposure to covid-19: Secondary | ICD-10-CM | POA: Diagnosis not present

## 2021-01-25 ENCOUNTER — Encounter: Payer: Medicare Other | Admitting: Family Medicine

## 2021-01-31 ENCOUNTER — Other Ambulatory Visit: Payer: Self-pay

## 2021-02-01 ENCOUNTER — Ambulatory Visit (INDEPENDENT_AMBULATORY_CARE_PROVIDER_SITE_OTHER): Payer: Medicare Other | Admitting: Family Medicine

## 2021-02-01 ENCOUNTER — Encounter: Payer: Self-pay | Admitting: Family Medicine

## 2021-02-01 VITALS — BP 110/70 | HR 68 | Temp 98.6°F | Ht 66.5 in | Wt 151.2 lb

## 2021-02-01 DIAGNOSIS — D1801 Hemangioma of skin and subcutaneous tissue: Secondary | ICD-10-CM | POA: Diagnosis not present

## 2021-02-01 DIAGNOSIS — Z23 Encounter for immunization: Secondary | ICD-10-CM

## 2021-02-01 DIAGNOSIS — E559 Vitamin D deficiency, unspecified: Secondary | ICD-10-CM

## 2021-02-01 DIAGNOSIS — L728 Other follicular cysts of the skin and subcutaneous tissue: Secondary | ICD-10-CM | POA: Diagnosis not present

## 2021-02-01 DIAGNOSIS — J449 Chronic obstructive pulmonary disease, unspecified: Secondary | ICD-10-CM | POA: Diagnosis not present

## 2021-02-01 DIAGNOSIS — L718 Other rosacea: Secondary | ICD-10-CM | POA: Diagnosis not present

## 2021-02-01 DIAGNOSIS — M1000 Idiopathic gout, unspecified site: Secondary | ICD-10-CM

## 2021-02-01 DIAGNOSIS — E039 Hypothyroidism, unspecified: Secondary | ICD-10-CM

## 2021-02-01 DIAGNOSIS — M129 Arthropathy, unspecified: Secondary | ICD-10-CM

## 2021-02-01 DIAGNOSIS — L820 Inflamed seborrheic keratosis: Secondary | ICD-10-CM | POA: Diagnosis not present

## 2021-02-01 DIAGNOSIS — R739 Hyperglycemia, unspecified: Secondary | ICD-10-CM

## 2021-02-01 DIAGNOSIS — E782 Mixed hyperlipidemia: Secondary | ICD-10-CM | POA: Diagnosis not present

## 2021-02-01 LAB — BASIC METABOLIC PANEL
BUN: 20 mg/dL (ref 6–23)
CO2: 28 mEq/L (ref 19–32)
Calcium: 9.7 mg/dL (ref 8.4–10.5)
Chloride: 100 mEq/L (ref 96–112)
Creatinine, Ser: 0.97 mg/dL (ref 0.40–1.20)
GFR: 58.56 mL/min — ABNORMAL LOW (ref >60.00)
Glucose, Bld: 84 mg/dL (ref 70–99)
Potassium: 4.6 mEq/L (ref 3.5–5.1)
Sodium: 137 mEq/L (ref 135–145)

## 2021-02-01 LAB — CBC WITH DIFFERENTIAL/PLATELET
Basophils Absolute: 0.1 10*3/uL (ref 0.0–0.1)
Basophils Relative: 0.6 % (ref 0.0–3.0)
Eosinophils Absolute: 0.1 10*3/uL (ref 0.0–0.7)
Eosinophils Relative: 1.1 % (ref 0.0–5.0)
HCT: 36.7 % (ref 36.0–46.0)
Hemoglobin: 12.6 g/dL (ref 12.0–15.0)
Lymphocytes Relative: 14 % (ref 12.0–46.0)
Lymphs Abs: 1.3 10*3/uL (ref 0.7–4.0)
MCHC: 34.3 g/dL (ref 30.0–36.0)
MCV: 88.3 fl (ref 78.0–100.0)
Monocytes Absolute: 0.5 10*3/uL (ref 0.1–1.0)
Monocytes Relative: 5.8 % (ref 3.0–12.0)
Neutro Abs: 7.1 10*3/uL (ref 1.4–7.7)
Neutrophils Relative %: 78.5 % — ABNORMAL HIGH (ref 43.0–77.0)
Platelets: 410 10*3/uL — ABNORMAL HIGH (ref 150.0–400.0)
RBC: 4.16 Mil/uL (ref 3.87–5.11)
RDW: 12.9 % (ref 11.5–15.5)
WBC: 9 10*3/uL (ref 4.0–10.5)

## 2021-02-01 LAB — HEPATIC FUNCTION PANEL
ALT: 11 U/L (ref 0–35)
AST: 16 U/L (ref 0–37)
Albumin: 4.3 g/dL (ref 3.5–5.2)
Alkaline Phosphatase: 109 U/L (ref 39–117)
Bilirubin, Direct: 0.1 mg/dL (ref 0.0–0.3)
Total Bilirubin: 0.4 mg/dL (ref 0.2–1.2)
Total Protein: 6.9 g/dL (ref 6.0–8.3)

## 2021-02-01 LAB — LIPID PANEL
Cholesterol: 194 mg/dL (ref 0–200)
HDL: 66.9 mg/dL (ref >39.00)
LDL Cholesterol: 110 mg/dL — ABNORMAL HIGH (ref 0–99)
NonHDL: 127.16
Total CHOL/HDL Ratio: 3
Triglycerides: 85 mg/dL (ref 0.0–149.0)
VLDL: 17 mg/dL (ref 0.0–40.0)

## 2021-02-01 LAB — VITAMIN D 25 HYDROXY (VIT D DEFICIENCY, FRACTURES): VITD: 30.57 ng/mL (ref 30.00–100.00)

## 2021-02-01 LAB — TSH: TSH: 0.91 u[IU]/mL (ref 0.35–5.50)

## 2021-02-01 LAB — T4, FREE: Free T4: 1.57 ng/dL (ref 0.60–1.60)

## 2021-02-01 LAB — T3, FREE: T3, Free: 3.6 pg/mL (ref 2.3–4.2)

## 2021-02-01 LAB — HEMOGLOBIN A1C: Hgb A1c MFr Bld: 5.6 % (ref 4.6–6.5)

## 2021-02-01 MED ORDER — ROSUVASTATIN CALCIUM 10 MG PO TABS
ORAL_TABLET | ORAL | 3 refills | Status: DC
Start: 2021-02-01 — End: 2022-02-21

## 2021-02-01 MED ORDER — LEVOTHYROXINE SODIUM 100 MCG PO TABS
100.0000 ug | ORAL_TABLET | Freq: Every day | ORAL | 3 refills | Status: DC
Start: 2021-02-01 — End: 2022-01-09

## 2021-02-01 MED ORDER — TRIAMCINOLONE ACETONIDE 0.1 % EX CREA: 1.0000 "application " | TOPICAL_CREAM | Freq: Two times a day (BID) | CUTANEOUS | 3 refills | Status: DC

## 2021-02-01 MED ORDER — DICLOFENAC SODIUM 75 MG PO TBEC
75.0000 mg | DELAYED_RELEASE_TABLET | Freq: Two times a day (BID) | ORAL | 3 refills | Status: DC
Start: 2021-02-01 — End: 2021-09-12

## 2021-02-01 NOTE — Progress Notes (Signed)
   Subjective:    Patient ID: KHRISTIN KELEHER, female    DOB: [birth year], 70 y.o.   MRN: [id number]  HPI Here to follow up on issues. She is doing well in general. She still takes HRT for hot flashes. Dr. Phineas Real retired so she is trying to establish with a new GYN. Her COPD is stable. Her trochanteric bursitis has calmed down and now barely bothers her.    Review of Systems  Constitutional: Negative.   HENT: Negative.    Eyes: Negative.   Respiratory: Negative.    Cardiovascular: Negative.   Gastrointestinal: Negative.   Genitourinary:  Negative for decreased urine volume, difficulty urinating, dyspareunia, dysuria, enuresis, flank pain, frequency, hematuria, pelvic pain and urgency.  Musculoskeletal: Negative.   Skin: Negative.   Neurological: Negative.  Negative for headaches.  Psychiatric/Behavioral: Negative.        Objective:   Physical Exam Constitutional:      General: She is not in acute distress.    Appearance: She is well-developed.  HENT:     Head: Normocephalic and atraumatic.     Right Ear: External ear normal.     Left Ear: External ear normal.     Nose: Nose normal.     Mouth/Throat:     Pharynx: No oropharyngeal exudate.  Eyes:     General: No scleral icterus.    Conjunctiva/sclera: Conjunctivae normal.     Pupils: Pupils are equal, round, and reactive to light.  Neck:     Thyroid: No thyromegaly.     Vascular: No JVD.  Cardiovascular:     Rate and Rhythm: Normal rate and regular rhythm.     Heart sounds: Normal heart sounds. No murmur heard.   No friction rub. No gallop.  Pulmonary:     Effort: Pulmonary effort is normal. No respiratory distress.     Breath sounds: Normal breath sounds. No wheezing or rales.  Chest:     Chest wall: No tenderness.  Abdominal:     General: Bowel sounds are normal. There is no distension.     Palpations: Abdomen is soft. There is no mass.     Tenderness: There is no abdominal tenderness. There is no guarding or  rebound.  Musculoskeletal:        General: No tenderness. Normal range of motion.     Cervical back: Normal range of motion and neck supple.  Lymphadenopathy:     Cervical: No cervical adenopathy.  Skin:    General: Skin is warm and dry.     Findings: No erythema or rash.  Neurological:     Mental Status: She is alert and oriented to person, place, and time.     Cranial Nerves: No cranial nerve deficit.     Motor: No abnormal muscle tone.     Coordination: Coordination normal.     Deep Tendon Reflexes: Reflexes are normal and symmetric. Reflexes normal.  Psychiatric:        Behavior: Behavior normal.        Thought Content: Thought content normal.        Judgment: Judgment normal.          Assessment & Plan:  Her COPD and bursitis are stable. We will send her for another CXR soon. Get fasting labs to check lipids, thyroid levels, etc. We spent 35 minutes reviewing her records and discussing these issues.  Alysia Penna, MD

## 2021-02-01 NOTE — Addendum Note (Signed)
Addended by: Amanda Cockayne on: 02/01/2021 09:22 AM   Modules accepted: Orders

## 2021-02-01 NOTE — Addendum Note (Signed)
Addended by: Wyvonne Lenz on: 02/01/2021 11:46 AM   Modules accepted: Orders

## 2021-02-21 ENCOUNTER — Telehealth: Payer: Self-pay | Admitting: Family Medicine

## 2021-02-21 NOTE — Telephone Encounter (Addendum)
Patient called back. Explained to patient that this appt is for the AWV that is part of medicare insurance that is offered once a year. Pt declined to make appointment at this time. Patient saw Dr. Sarajane Jews 02/01/21

## 2021-02-21 NOTE — Telephone Encounter (Signed)
Left message for patient to call back and schedule Medicare Annual Wellness Visit (AWV) either virtually or in office.   Last AWV 01/05/14  please schedule at anytime with [facility] Nurse Health Advisor 1 or 2   This should be a 45 minute visit.

## 2021-02-22 ENCOUNTER — Encounter: Payer: Self-pay | Admitting: Family Medicine

## 2021-02-22 ENCOUNTER — Telehealth (INDEPENDENT_AMBULATORY_CARE_PROVIDER_SITE_OTHER): Payer: Medicare Other | Admitting: Family Medicine

## 2021-02-22 VITALS — Temp 99.4°F | Wt 151.2 lb

## 2021-02-22 DIAGNOSIS — J029 Acute pharyngitis, unspecified: Secondary | ICD-10-CM | POA: Diagnosis not present

## 2021-02-22 MED ORDER — CEFUROXIME AXETIL 500 MG PO TABS: 500.0000 mg | ORAL_TABLET | Freq: Two times a day (BID) | ORAL | 0 refills | Status: AC

## 2021-02-22 NOTE — Progress Notes (Signed)
   Subjective:    Patient ID: Stacy Gillespie, female    DOB: Jul 31, 1950, 70 y.o.   MRN: [id number]  HPI Virtual Visit via Telephone Note  I connected with the patient on 02/22/21 at 10:30 AM EDT by telephone and verified that I am speaking with the correct person using two identifiers.   I discussed the limitations, risks, security and privacy concerns of performing an evaluation and management service by telephone and the availability of in person appointments. I also discussed with the patient that there may be a patient responsible charge related to this service. The patient expressed understanding and agreed to proceed.  Location patient: home Location provider: work or home office Participants present for the call: patient, provider Patient did not have a visit in the prior 7 days to address this/these issue(s).   History of Present Illness: Here for 10 days of a bad ST. Sometimes she has trouble swallowing but no trouble breathing. She has had a fever to 99.4 degrees. No body aches or headache. No cough or SOB. No NVD. Taking Tylenol and drinking fluids.    Observations/Objective: Patient sounds cheerful and well on the phone. I do not appreciate any SOB. Speech and thought processing are grossly intact. Patient reported vitals:  Assessment and Plan: Pharyngitis, likely due to strep. Treat with 10 days of Cefuroxime.  Alysia Penna, MD   Follow Up Instructions:     [phone] [phone] 11-20 [phone] 21-30 I did not refer this patient for an OV in the next 24 hours for this/these issue(s).  I discussed the assessment and treatment plan with the patient. The patient was provided an opportunity to ask questions and all were answered. The patient agreed with the plan and demonstrated an understanding of the instructions.   The patient was advised to call back or seek an in-person evaluation if the symptoms worsen or if the condition fails to improve as anticipated.  I provided  15 minutes of non-face-to-face time during this encounter.   Alysia Penna, MD     Review of Systems     Objective:   Physical Exam        Assessment & Plan:

## 2021-03-09 ENCOUNTER — Telehealth: Payer: Self-pay

## 2021-03-09 NOTE — Telephone Encounter (Signed)
Spoke with pt this morning, reported that she just received a No Show letter for a visit she had with Dr Sarajane Jews on 02/22/2021. Pt state that someone from the office called her to screen her and told her that she could not come to the office since she had a sore throat. Pt state that she wants to have this looked at since she had a Video visit with Dr Sarajane Jews on the same day.

## 2021-03-25 ENCOUNTER — Ambulatory Visit (INDEPENDENT_AMBULATORY_CARE_PROVIDER_SITE_OTHER): Payer: Medicare Other

## 2021-03-25 DIAGNOSIS — J449 Chronic obstructive pulmonary disease, unspecified: Secondary | ICD-10-CM

## 2021-04-11 ENCOUNTER — Telehealth: Payer: Self-pay | Admitting: Family Medicine

## 2021-04-11 NOTE — Telephone Encounter (Signed)
Left message for patient to call back and schedule Medicare Annual Wellness Visit (AWV) either virtually or in office. Left  my Stacy Gillespie number [phone]   awvi 12/23/14 per [facility]  please schedule at anytime with [facility] Nurse Health Advisor 1 or 2   This should be a 45 minute visit.

## 2021-04-11 NOTE — Telephone Encounter (Signed)
Patient returned my call  Pt declined stating she like to see PCP only

## 2021-04-19 ENCOUNTER — Other Ambulatory Visit: Payer: Self-pay | Admitting: Family Medicine

## 2021-04-19 DIAGNOSIS — Z1231 Encounter for screening mammogram for malignant neoplasm of breast: Secondary | ICD-10-CM

## 2021-04-27 ENCOUNTER — Other Ambulatory Visit: Payer: Self-pay

## 2021-04-28 ENCOUNTER — Ambulatory Visit (INDEPENDENT_AMBULATORY_CARE_PROVIDER_SITE_OTHER): Payer: Medicare Other

## 2021-04-28 DIAGNOSIS — Z23 Encounter for immunization: Secondary | ICD-10-CM

## 2021-05-24 DIAGNOSIS — C801 Malignant (primary) neoplasm, unspecified: Secondary | ICD-10-CM

## 2021-05-24 HISTORY — DX: Malignant (primary) neoplasm, unspecified: C80.1

## 2021-05-24 HISTORY — PX: BREAST BIOPSY: SHX20

## 2021-06-02 ENCOUNTER — Other Ambulatory Visit: Payer: Self-pay

## 2021-06-02 ENCOUNTER — Ambulatory Visit
Admission: RE | Admit: 2021-06-02 | Discharge: 2021-06-02 | Disposition: A | Discharge status: Home / Self Care | Payer: Medicare Other | Source: Ambulatory Visit | Attending: Family Medicine | Admitting: Family Medicine

## 2021-06-02 DIAGNOSIS — L720 Epidermal cyst: Secondary | ICD-10-CM | POA: Diagnosis not present

## 2021-06-02 DIAGNOSIS — L718 Other rosacea: Secondary | ICD-10-CM | POA: Diagnosis not present

## 2021-06-02 DIAGNOSIS — Z1231 Encounter for screening mammogram for malignant neoplasm of breast: Secondary | ICD-10-CM | POA: Diagnosis not present

## 2021-06-02 DIAGNOSIS — L821 Other seborrheic keratosis: Secondary | ICD-10-CM | POA: Diagnosis not present

## 2021-06-02 DIAGNOSIS — D485 Neoplasm of uncertain behavior of skin: Secondary | ICD-10-CM | POA: Diagnosis not present

## 2021-06-06 ENCOUNTER — Other Ambulatory Visit: Payer: Self-pay | Admitting: Family Medicine

## 2021-06-06 DIAGNOSIS — R928 Other abnormal and inconclusive findings on diagnostic imaging of breast: Secondary | ICD-10-CM

## 2021-06-08 ENCOUNTER — Other Ambulatory Visit: Payer: Self-pay | Admitting: Family Medicine

## 2021-06-08 ENCOUNTER — Ambulatory Visit
Admission: RE | Admit: 2021-06-08 | Discharge: 2021-06-08 | Disposition: A | Discharge status: Home / Self Care | Payer: Medicare Other | Source: Ambulatory Visit | Attending: Family Medicine | Admitting: Family Medicine

## 2021-06-08 ENCOUNTER — Other Ambulatory Visit: Payer: Self-pay

## 2021-06-08 DIAGNOSIS — R928 Other abnormal and inconclusive findings on diagnostic imaging of breast: Secondary | ICD-10-CM

## 2021-06-08 DIAGNOSIS — N6489 Other specified disorders of breast: Secondary | ICD-10-CM

## 2021-06-08 DIAGNOSIS — R922 Inconclusive mammogram: Secondary | ICD-10-CM | POA: Diagnosis not present

## 2021-06-23 DIAGNOSIS — C50912 Malignant neoplasm of unspecified site of left female breast: Secondary | ICD-10-CM | POA: Insufficient documentation

## 2021-06-23 DIAGNOSIS — S82892A Other fracture of left lower leg, initial encounter for closed fracture: Secondary | ICD-10-CM | POA: Insufficient documentation

## 2021-06-24 ENCOUNTER — Ambulatory Visit
Admission: RE | Admit: 2021-06-24 | Discharge: 2021-06-24 | Disposition: A | Discharge status: Home / Self Care | Payer: Medicare Other | Source: Ambulatory Visit | Attending: Family Medicine | Admitting: Family Medicine

## 2021-06-24 ENCOUNTER — Other Ambulatory Visit: Payer: Self-pay

## 2021-06-24 DIAGNOSIS — Z170 Estrogen receptor positive status [ER+]: Secondary | ICD-10-CM | POA: Diagnosis not present

## 2021-06-24 DIAGNOSIS — N6489 Other specified disorders of breast: Secondary | ICD-10-CM

## 2021-06-24 DIAGNOSIS — C50812 Malignant neoplasm of overlapping sites of left female breast: Secondary | ICD-10-CM | POA: Diagnosis not present

## 2021-06-24 DIAGNOSIS — N6325 Unspecified lump in the left breast, overlapping quadrants: Secondary | ICD-10-CM | POA: Diagnosis not present

## 2021-06-29 ENCOUNTER — Telehealth: Payer: Self-pay | Admitting: Hematology and Oncology

## 2021-06-29 NOTE — Telephone Encounter (Signed)
Spoke to patient to confirm afternoon clinic appointment for 12/14, packet will be mailed to patient

## 2021-07-01 ENCOUNTER — Encounter: Payer: Self-pay | Admitting: *Deleted

## 2021-07-04 ENCOUNTER — Other Ambulatory Visit: Payer: Self-pay | Admitting: *Deleted

## 2021-07-04 DIAGNOSIS — C50412 Malignant neoplasm of upper-outer quadrant of left female breast: Secondary | ICD-10-CM | POA: Insufficient documentation

## 2021-07-04 DIAGNOSIS — Z170 Estrogen receptor positive status [ER+]: Secondary | ICD-10-CM | POA: Insufficient documentation

## 2021-07-06 ENCOUNTER — Ambulatory Visit: Payer: Medicare Other | Admitting: Radiation Oncology

## 2021-07-06 ENCOUNTER — Ambulatory Visit: Payer: Medicare Other | Admitting: Hematology and Oncology

## 2021-07-06 ENCOUNTER — Ambulatory Visit: Payer: Medicare Other | Admitting: Physical Therapy

## 2021-07-06 ENCOUNTER — Other Ambulatory Visit: Payer: Medicare Other

## 2021-07-12 DIAGNOSIS — N6321 Unspecified lump in the left breast, upper outer quadrant: Secondary | ICD-10-CM | POA: Diagnosis not present

## 2021-07-12 DIAGNOSIS — N6325 Unspecified lump in the left breast, overlapping quadrants: Secondary | ICD-10-CM | POA: Diagnosis not present

## 2021-07-14 ENCOUNTER — Encounter: Payer: Self-pay | Admitting: *Deleted

## 2021-07-19 DIAGNOSIS — C50412 Malignant neoplasm of upper-outer quadrant of left female breast: Secondary | ICD-10-CM | POA: Diagnosis not present

## 2021-07-19 DIAGNOSIS — Z170 Estrogen receptor positive status [ER+]: Secondary | ICD-10-CM | POA: Diagnosis not present

## 2021-07-26 DIAGNOSIS — L719 Rosacea, unspecified: Secondary | ICD-10-CM | POA: Insufficient documentation

## 2021-07-26 DIAGNOSIS — M1990 Unspecified osteoarthritis, unspecified site: Secondary | ICD-10-CM | POA: Insufficient documentation

## 2021-07-26 DIAGNOSIS — J449 Chronic obstructive pulmonary disease, unspecified: Secondary | ICD-10-CM | POA: Insufficient documentation

## 2021-07-26 DIAGNOSIS — J4489 Other specified chronic obstructive pulmonary disease: Secondary | ICD-10-CM | POA: Insufficient documentation

## 2021-07-26 DIAGNOSIS — M7071 Other bursitis of hip, right hip: Secondary | ICD-10-CM | POA: Insufficient documentation

## 2021-07-26 DIAGNOSIS — Z8639 Personal history of other endocrine, nutritional and metabolic disease: Secondary | ICD-10-CM | POA: Insufficient documentation

## 2021-07-27 DIAGNOSIS — C50412 Malignant neoplasm of upper-outer quadrant of left female breast: Secondary | ICD-10-CM | POA: Diagnosis not present

## 2021-07-27 DIAGNOSIS — Z170 Estrogen receptor positive status [ER+]: Secondary | ICD-10-CM | POA: Diagnosis not present

## 2021-07-27 DIAGNOSIS — C50912 Malignant neoplasm of unspecified site of left female breast: Secondary | ICD-10-CM | POA: Diagnosis not present

## 2021-08-09 NOTE — Progress Notes (Deleted)
71 y.o. G0P0000 Married White or Caucasian Not Hispanic or Latino female here for annual exam.      No LMP recorded. Patient is postmenopausal.          Sexually active: {yes no:314532}  The current method of family planning is {contraception:315051}.    Exercising: {yes no:314532}  {types:19826} Smoker:  {YES [id number]  Health Maintenance: Pap:  09/24/13 WNL Hr HPV Neg  History of abnormal Pap:  no MMG:  06/03/21 see epic  BMD:   Osteopenia.  DEXA 2020 T score -1.3.  Stable from prior DEXA. Colonoscopy: *** TDaP:  12/22/10 Gardasil: n/a   reports that she quit smoking about 7 years ago. Her smoking use included cigarettes. She smoked an average of .5 packs per day. She has never used smokeless tobacco. She reports current alcohol use. She reports that she does not use drugs.  Past Medical History:  Diagnosis Date   Arthritis    COPD (chronic obstructive pulmonary disease) ([place])    Hyperlipidemia    Osteopenia 07/2018   T score -1.3 FRAX 9% / 1.1%   Thyroid disease     Past Surgical History:  Procedure Laterality Date   BROKEN LEG     COLONOSCOPY  02-26-12   per Dr. Fuller Plan, clear, repeat in 10 yrs    NOSE SURGERY      Current Outpatient Medications  Medication Sig Dispense Refill   Calcium Citrate-Vitamin D (CITRACAL + D PO) Take 1 tablet by mouth daily.      diclofenac (VOLTAREN) 75 MG EC tablet Take 1 tablet (75 mg total) by mouth 2 (two) times daily. 180 tablet 3   estrogen, conjugated,-medroxyprogesterone (PREMPRO) 0.3-1.5 MG tablet Take 1 tablet by mouth daily. 90 tablet 0   levothyroxine (SYNTHROID) 100 MCG tablet Take 1 tablet (100 mcg total) by mouth daily. 90 tablet 3   rosuvastatin (CRESTOR) 10 MG tablet Take 1 tablet by mouth  daily 90 tablet 3   triamcinolone (NASACORT AQ) 55 MCG/ACT AERO nasal inhaler Place 2 sprays into the nose daily. 3 Inhaler 3   triamcinolone cream (KENALOG) 0.1 % Apply 1 application topically 2 (two) times daily. 45 g 3   VITAMIN D PO Take  by mouth. Taking with calcium and vitamin D combination tab.     No current facility-administered medications for this visit.    Family History  Problem Relation Age of Onset   Arthritis Mother    Hypertension Mother    Arthritis Brother    Heart disease Brother    Colon cancer Maternal Grandfather 53   Colon cancer Paternal Grandfather 69   Heart disease Paternal Grandmother    Stomach cancer Maternal Grandmother 60   Breast cancer Neg Hx     Review of Systems  Exam:   There were no vitals taken for this visit.  Weight change: @WEIGHTCHANGE @ Height:      Ht Readings from Last 3 Encounters:  02/01/21 5' 6.5" (1.689 m)  07/22/19 5' 6.25" (1.683 m)  01/21/19 5' 6.5" (1.689 m)    General appearance: alert, cooperative and appears stated age Head: Normocephalic, without obvious abnormality, atraumatic Neck: no adenopathy, supple, symmetrical, trachea midline and thyroid {CHL AMB PHY EX THYROID NORM DEFAULT:[phone]::"normal to inspection and palpation"} Lungs: clear to auscultation bilaterally Cardiovascular: regular rate and rhythm Breasts: {Exam; breast:13139::"normal appearance, no masses or tenderness"} Abdomen: soft, non-tender; non distended,  no masses,  no organomegaly Extremities: extremities normal, atraumatic, no cyanosis or edema Skin: Skin color, texture, turgor normal.  No rashes or lesions Lymph nodes: Cervical, supraclavicular, and axillary nodes normal. No abnormal inguinal nodes palpated Neurologic: Grossly normal   Pelvic: External genitalia:  no lesions              Urethra:  normal appearing urethra with no masses, tenderness or lesions              Bartholins and Skenes: normal                 Vagina: normal appearing vagina with normal color and discharge, no lesions              Cervix: {CHL AMB PHY EX CERVIX NORM [phone]::"no lesions"}               Bimanual Exam:  Uterus:  {CHL AMB PHY EX UTERUS NORM DEFAULT:[phone]::"normal size,  contour, position, consistency, mobility, non-tender"}              Adnexa: {CHL AMB PHY EX ADNEXA NO MASS DEFAULT:[phone]::"no mass, fullness, tenderness"}               Rectovaginal: Confirms               Anus:  normal sphincter tone, no lesions  *** chaperoned for the exam.  A:  Well Woman with normal exam  P:

## 2021-08-11 ENCOUNTER — Ambulatory Visit: Payer: Medicare Other | Admitting: Obstetrics and Gynecology

## 2021-08-15 DIAGNOSIS — C50412 Malignant neoplasm of upper-outer quadrant of left female breast: Secondary | ICD-10-CM | POA: Diagnosis not present

## 2021-08-15 DIAGNOSIS — Z170 Estrogen receptor positive status [ER+]: Secondary | ICD-10-CM | POA: Diagnosis not present

## 2021-08-16 DIAGNOSIS — Z170 Estrogen receptor positive status [ER+]: Secondary | ICD-10-CM | POA: Diagnosis not present

## 2021-08-16 DIAGNOSIS — N6022 Fibroadenosis of left breast: Secondary | ICD-10-CM | POA: Diagnosis not present

## 2021-08-16 DIAGNOSIS — J449 Chronic obstructive pulmonary disease, unspecified: Secondary | ICD-10-CM | POA: Diagnosis not present

## 2021-08-16 DIAGNOSIS — M71552 Other bursitis, not elsewhere classified, left hip: Secondary | ICD-10-CM | POA: Diagnosis not present

## 2021-08-16 DIAGNOSIS — C50912 Malignant neoplasm of unspecified site of left female breast: Secondary | ICD-10-CM | POA: Diagnosis not present

## 2021-08-16 DIAGNOSIS — N6092 Unspecified benign mammary dysplasia of left breast: Secondary | ICD-10-CM | POA: Diagnosis not present

## 2021-08-16 DIAGNOSIS — R928 Other abnormal and inconclusive findings on diagnostic imaging of breast: Secondary | ICD-10-CM | POA: Diagnosis not present

## 2021-08-16 DIAGNOSIS — Z79899 Other long term (current) drug therapy: Secondary | ICD-10-CM | POA: Diagnosis not present

## 2021-08-16 DIAGNOSIS — M71551 Other bursitis, not elsewhere classified, right hip: Secondary | ICD-10-CM | POA: Diagnosis not present

## 2021-08-16 DIAGNOSIS — M1990 Unspecified osteoarthritis, unspecified site: Secondary | ICD-10-CM | POA: Diagnosis not present

## 2021-08-16 DIAGNOSIS — G8929 Other chronic pain: Secondary | ICD-10-CM | POA: Diagnosis not present

## 2021-08-16 DIAGNOSIS — C50412 Malignant neoplasm of upper-outer quadrant of left female breast: Secondary | ICD-10-CM | POA: Diagnosis not present

## 2021-08-16 DIAGNOSIS — N6012 Diffuse cystic mastopathy of left breast: Secondary | ICD-10-CM | POA: Diagnosis not present

## 2021-08-16 DIAGNOSIS — E039 Hypothyroidism, unspecified: Secondary | ICD-10-CM | POA: Diagnosis not present

## 2021-08-16 DIAGNOSIS — M25572 Pain in left ankle and joints of left foot: Secondary | ICD-10-CM | POA: Diagnosis not present

## 2021-08-16 DIAGNOSIS — E559 Vitamin D deficiency, unspecified: Secondary | ICD-10-CM | POA: Diagnosis not present

## 2021-08-16 DIAGNOSIS — Z87891 Personal history of nicotine dependence: Secondary | ICD-10-CM | POA: Diagnosis not present

## 2021-08-16 DIAGNOSIS — R921 Mammographic calcification found on diagnostic imaging of breast: Secondary | ICD-10-CM | POA: Diagnosis not present

## 2021-08-16 DIAGNOSIS — E785 Hyperlipidemia, unspecified: Secondary | ICD-10-CM | POA: Diagnosis not present

## 2021-08-16 HISTORY — PX: BREAST LUMPECTOMY: SHX2

## 2021-08-24 IMAGING — MG DIGITAL SCREENING BILAT W/ TOMO W/ CAD
8 series · 8 of 24 positions shown · non-contrast
Comparison: Previous exam(s).

CLINICAL DATA: Screening.

EXAM:
DIGITAL SCREENING BILATERAL MAMMOGRAM WITH TOMO AND CAD

[L CC synth-2D]
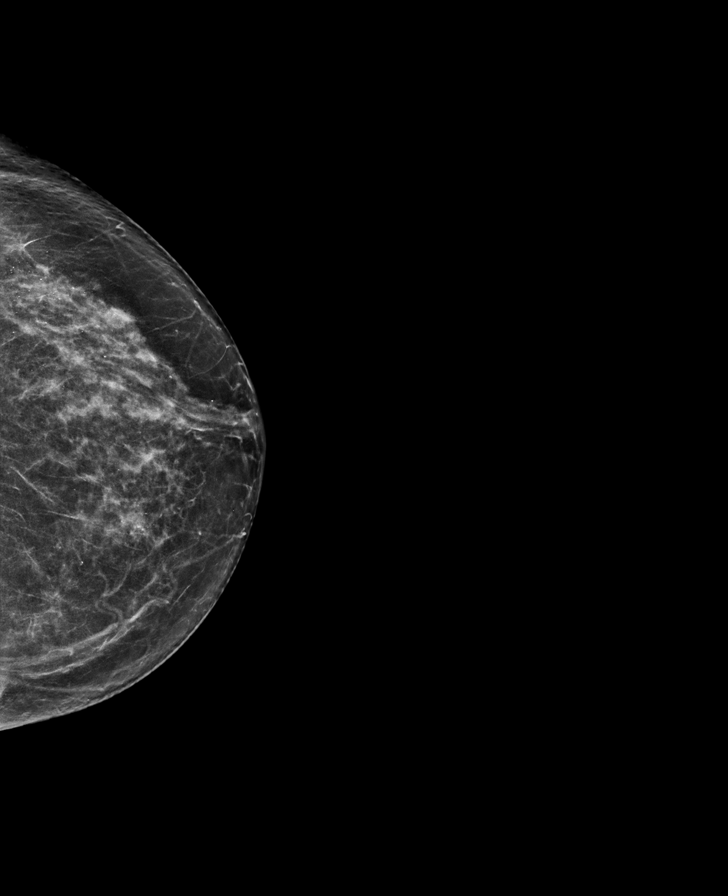

[R CC synth-2D]
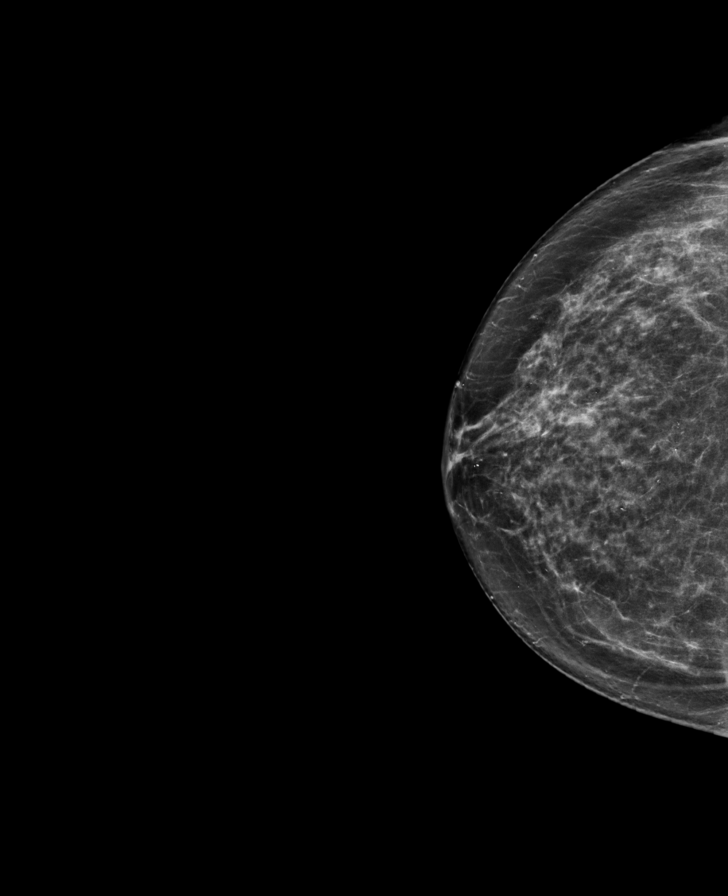

[L MLO synth-2D]
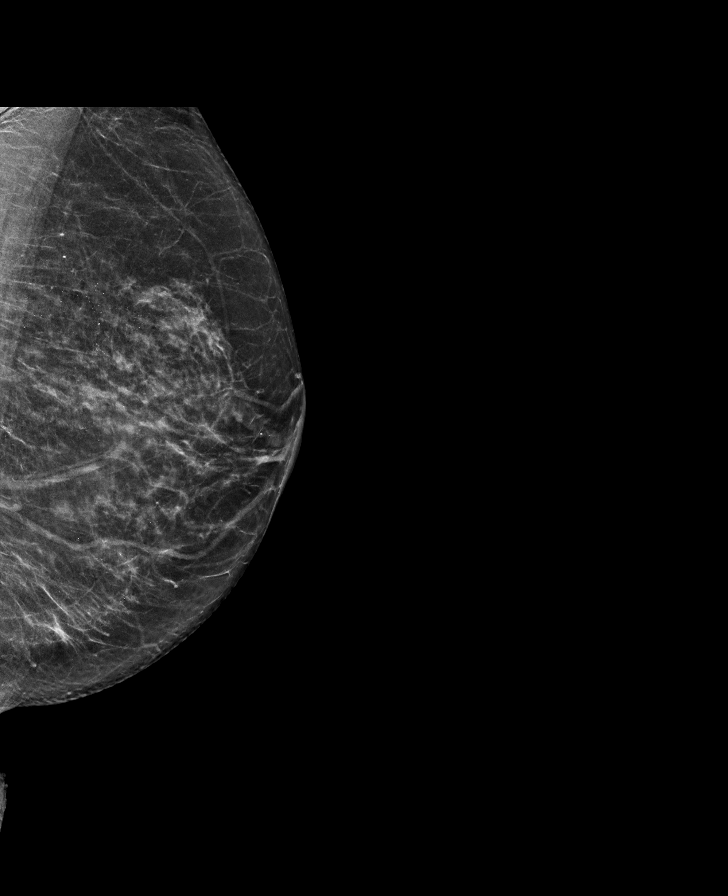

[R MLO synth-2D]
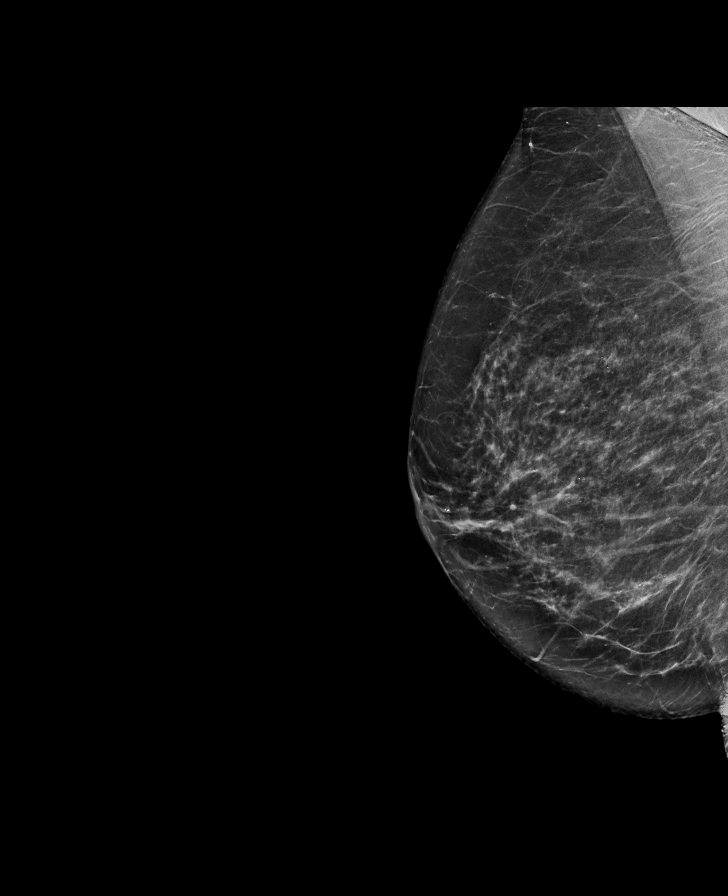

[L MLO tomo · tomo slice 33/66.0]
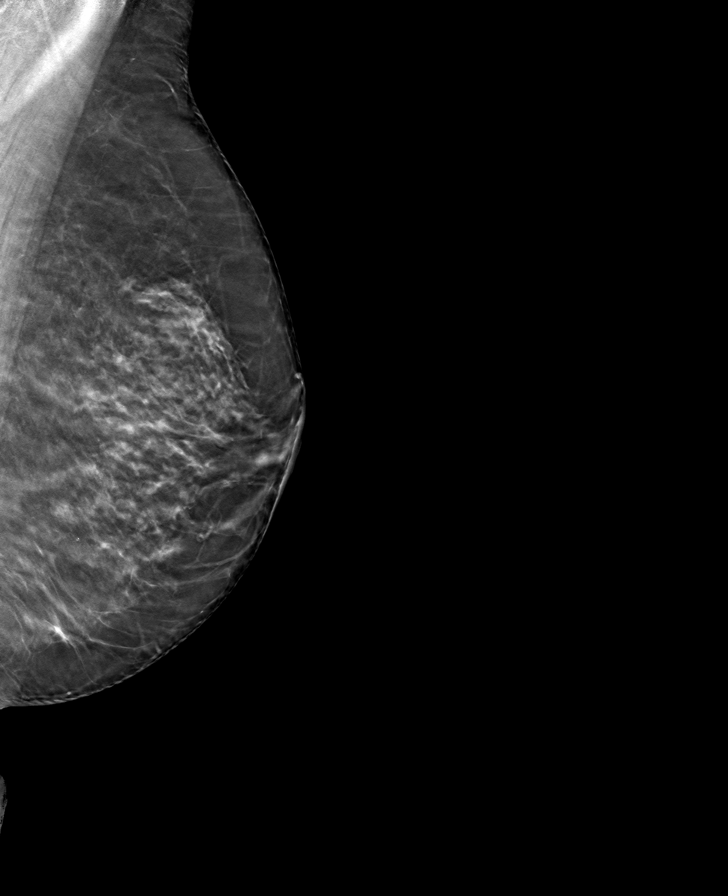

[R CC tomo · tomo slice 37/72.0]
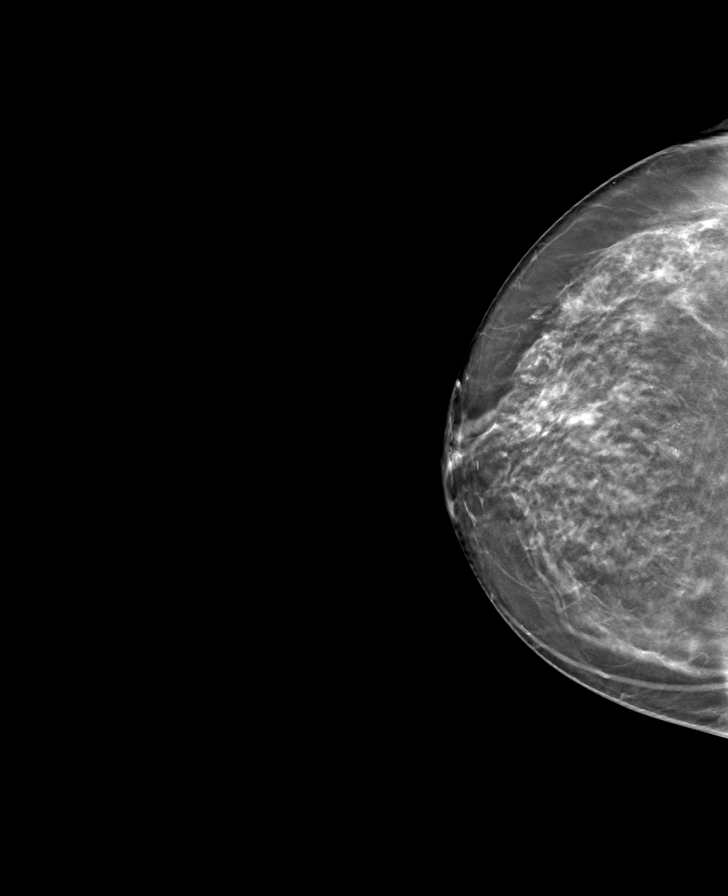

[R MLO tomo · tomo slice 37/73.0]
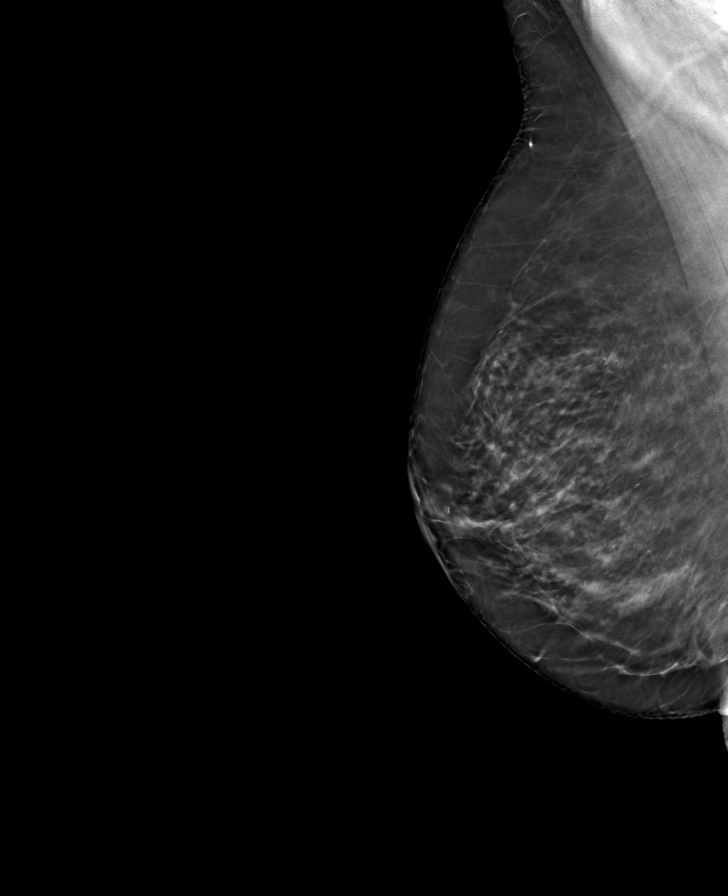

[L CC tomo · tomo slice 35/69.0]
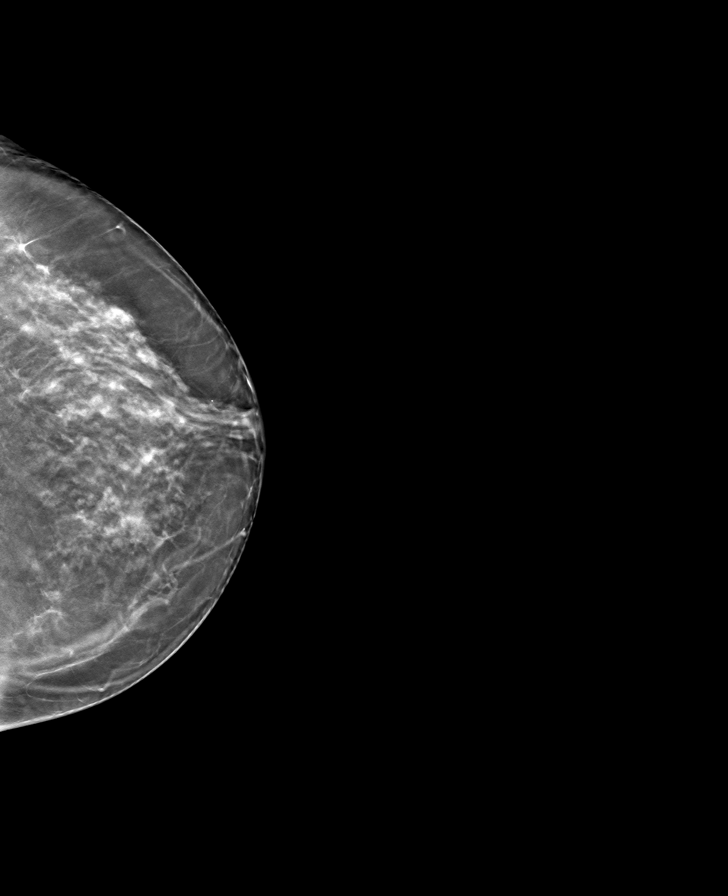

[8 of 24 positions shown; findings below may reference images not displayed]

ACR Breast Density Category b: There are scattered areas of
fibroglandular density.
FINDINGS: There are no findings suspicious for malignancy. Images were
processed with CAD.
IMPRESSION: No mammographic evidence of malignancy. A result letter of this
screening mammogram will be mailed directly to the patient.

RECOMMENDATION:
Screening mammogram in one year. (Code:CN-U-775)

BI-RADS CATEGORY  1: Negative.

## 2021-09-01 ENCOUNTER — Encounter: Payer: Self-pay | Admitting: Family Medicine

## 2021-09-01 DIAGNOSIS — H04123 Dry eye syndrome of bilateral lacrimal glands: Secondary | ICD-10-CM | POA: Diagnosis not present

## 2021-09-01 DIAGNOSIS — H524 Presbyopia: Secondary | ICD-10-CM | POA: Diagnosis not present

## 2021-09-01 DIAGNOSIS — H25813 Combined forms of age-related cataract, bilateral: Secondary | ICD-10-CM | POA: Diagnosis not present

## 2021-09-01 DIAGNOSIS — H43811 Vitreous degeneration, right eye: Secondary | ICD-10-CM | POA: Diagnosis not present

## 2021-09-01 NOTE — Progress Notes (Signed)
71 y.o. G0P0000 Married White or Caucasian Not Hispanic or Latino female here for annual exam.  No vaginal bleeding.   She is s/p left lumpectomy for breast cancer on 08/16/21. She is doing well. She is going to start Radiation in the next few weeks.   She went off of HRT since her diagnosis. She is having some hot flashes and night sweats. She is having 3-4 sweats a night, tolerable. Daytime is fine. Not sexually active.   No bowel or bladder c/o.     No LMP recorded. Patient is postmenopausal.          Sexually active: No.  The current method of family planning is post menopausal status.    Exercising: Yes, senior exercise class  Smoker:  no- former smoker  Health Maintenance: Pap:  09/24/13 WNL 06/22/11 WNL  History of abnormal Pap:  no MMG:  2022 seen at [facility] for current breast cancer BMD:   08/05/2018 osteopenia, T score -1.3, FRAX 9.3/1.1  Colonoscopy: 02/26/12 f/u 10 years  TDaP:  12/22/10 Gardasil: none    reports that she quit smoking about 7 years ago. Her smoking use included cigarettes. She smoked an average of .5 packs per day. She has never used smokeless tobacco. She reports current alcohol use. She reports that she does not use drugs. Occasional ETOH.   Past Medical History:  Diagnosis Date   Arthritis    COPD (chronic obstructive pulmonary disease) ([place])    Hyperlipidemia    Osteopenia 07/2018   T score -1.3 FRAX 9% / 1.1%   Thyroid disease     Past Surgical History:  Procedure Laterality Date   BROKEN LEG     COLONOSCOPY  02-26-12   per Dr. Fuller Plan, clear, repeat in 10 yrs    NOSE SURGERY      Current Outpatient Medications  Medication Sig Dispense Refill   Calcium Citrate-Vitamin D (CITRACAL + D PO) Take 1 tablet by mouth daily.      levothyroxine (SYNTHROID) 100 MCG tablet Take 1 tablet (100 mcg total) by mouth daily. 90 tablet 3   rosuvastatin (CRESTOR) 10 MG tablet Take 1 tablet by mouth  daily 90 tablet 3   triamcinolone (NASACORT AQ) 55 MCG/ACT AERO  nasal inhaler Place 2 sprays into the nose daily. 3 Inhaler 3   triamcinolone cream (KENALOG) 0.1 % Apply 1 application topically 2 (two) times daily. 45 g 3   VITAMIN D PO Take by mouth. Taking with calcium and vitamin D combination tab.     No current facility-administered medications for this visit.    Family History  Problem Relation Age of Onset   Arthritis Mother    Hypertension Mother    Arthritis Brother    Heart disease Brother    Colon cancer Maternal Grandfather 73   Colon cancer Paternal Grandfather 74   Heart disease Paternal Grandmother    Stomach cancer Maternal Grandmother 74   Breast cancer Neg Hx     Review of Systems  All other systems reviewed and are negative.  Exam:   BP (!) 158/80 (BP Location: Right Arm, Patient Position: Sitting, Cuff Size: Normal)    Pulse 86    Resp 14    Ht 5' 5.75" (1.67 m)    Wt 150 lb (68 kg)    BMI 24.40 kg/m   Weight change: @WEIGHTCHANGE @ Height:   Height: 5' 5.75" (167 cm)  Ht Readings from Last 3 Encounters:  09/12/21 5' 5.75" (1.67 m)  02/01/21 5'  6.5" (1.689 m)  07/22/19 5' 6.25" (1.683 m)    General appearance: alert, cooperative and appears stated age Head: Normocephalic, without obvious abnormality, atraumatic Neck: no adenopathy, supple, symmetrical, trachea midline and thyroid normal to inspection and palpation Breasts:  left lumpectomy site is healing well, rest of the breast exam was deferred.  Abdomen: soft, non-tender; non distended,  no masses,  no organomegaly Extremities: extremities normal, atraumatic, no cyanosis or edema Skin: Skin color, texture, turgor normal. No rashes or lesions Lymph nodes: Cervical, supraclavicular, and axillary nodes normal. No abnormal inguinal nodes palpated Neurologic: Grossly normal   Pelvic: External genitalia:  no lesions              Urethra:  normal appearing urethra with no masses, tenderness or lesions              Bartholins and Skenes: normal                 Vagina:  normal appearing vagina with normal color and discharge, no lesions              Cervix: no lesions               Bimanual Exam:  Uterus:  normal size, contour, position, consistency, mobility, non-tender              Adnexa: no mass, fullness, tenderness               Rectovaginal: Confirms               Anus:  normal sphincter tone, no lesions  Karmen Bongo, RN chaperoned for the exam.  1. Encounter for gynecological examination without abnormal finding Mammogram UTD Colonoscopy due in 8/23, she will schedule  2. Screening for cervical cancer - Cytology - PAP  3. Osteopenia, unspecified location Discussed calcium/vit d - DG Bone Density; Future  4. Hypoestrogenism - DG Bone Density; Future  5. Malignant neoplasm of upper-outer quadrant of left breast in female, estrogen receptor positive ([place]) S/p lumpectomy last month, doing well  6. Vasomotor symptoms due to menopause Recently off of HRT, currently tolerable. Discussed the option of gabapentin at night if her night sweats are not tolerable

## 2021-09-07 DIAGNOSIS — C50412 Malignant neoplasm of upper-outer quadrant of left female breast: Secondary | ICD-10-CM | POA: Diagnosis not present

## 2021-09-07 DIAGNOSIS — Z170 Estrogen receptor positive status [ER+]: Secondary | ICD-10-CM | POA: Diagnosis not present

## 2021-09-12 ENCOUNTER — Other Ambulatory Visit (HOSPITAL_COMMUNITY)
Admission: RE | Admit: 2021-09-12 | Discharge: 2021-09-12 | Disposition: A | Discharge status: Home / Self Care | Payer: Medicare Other | Source: Ambulatory Visit | Attending: Obstetrics and Gynecology | Admitting: Obstetrics and Gynecology

## 2021-09-12 ENCOUNTER — Other Ambulatory Visit: Payer: Self-pay

## 2021-09-12 ENCOUNTER — Ambulatory Visit (INDEPENDENT_AMBULATORY_CARE_PROVIDER_SITE_OTHER): Payer: Medicare Other | Admitting: Obstetrics and Gynecology

## 2021-09-12 ENCOUNTER — Encounter: Payer: Self-pay | Admitting: Obstetrics and Gynecology

## 2021-09-12 VITALS — BP 158/80 | HR 86 | Resp 14 | Ht 65.75 in | Wt 150.0 lb

## 2021-09-12 DIAGNOSIS — Z124 Encounter for screening for malignant neoplasm of cervix: Secondary | ICD-10-CM

## 2021-09-12 DIAGNOSIS — E2839 Other primary ovarian failure: Secondary | ICD-10-CM | POA: Diagnosis not present

## 2021-09-12 DIAGNOSIS — Z01419 Encounter for gynecological examination (general) (routine) without abnormal findings: Secondary | ICD-10-CM

## 2021-09-12 DIAGNOSIS — M8580 Other specified disorders of bone density and structure, unspecified site: Secondary | ICD-10-CM

## 2021-09-12 DIAGNOSIS — Z9289 Personal history of other medical treatment: Secondary | ICD-10-CM | POA: Diagnosis not present

## 2021-09-12 DIAGNOSIS — Z9189 Other specified personal risk factors, not elsewhere classified: Secondary | ICD-10-CM

## 2021-09-12 DIAGNOSIS — C50412 Malignant neoplasm of upper-outer quadrant of left female breast: Secondary | ICD-10-CM

## 2021-09-12 DIAGNOSIS — Z170 Estrogen receptor positive status [ER+]: Secondary | ICD-10-CM

## 2021-09-12 DIAGNOSIS — N951 Menopausal and female climacteric states: Secondary | ICD-10-CM | POA: Diagnosis not present

## 2021-09-12 NOTE — Patient Instructions (Signed)

## 2021-09-13 DIAGNOSIS — C50412 Malignant neoplasm of upper-outer quadrant of left female breast: Secondary | ICD-10-CM | POA: Diagnosis not present

## 2021-09-13 DIAGNOSIS — Z170 Estrogen receptor positive status [ER+]: Secondary | ICD-10-CM | POA: Diagnosis not present

## 2021-09-15 LAB — CYTOLOGY - PAP: Diagnosis: NEGATIVE

## 2021-09-19 DIAGNOSIS — Z20822 Contact with and (suspected) exposure to covid-19: Secondary | ICD-10-CM | POA: Diagnosis not present

## 2021-09-20 DIAGNOSIS — C50412 Malignant neoplasm of upper-outer quadrant of left female breast: Secondary | ICD-10-CM | POA: Diagnosis not present

## 2021-09-26 DIAGNOSIS — C50412 Malignant neoplasm of upper-outer quadrant of left female breast: Secondary | ICD-10-CM | POA: Diagnosis not present

## 2021-10-03 DIAGNOSIS — C50412 Malignant neoplasm of upper-outer quadrant of left female breast: Secondary | ICD-10-CM | POA: Diagnosis not present

## 2021-10-04 DIAGNOSIS — Z20822 Contact with and (suspected) exposure to covid-19: Secondary | ICD-10-CM | POA: Diagnosis not present

## 2021-10-10 DIAGNOSIS — Z170 Estrogen receptor positive status [ER+]: Secondary | ICD-10-CM | POA: Diagnosis not present

## 2021-10-10 DIAGNOSIS — C50412 Malignant neoplasm of upper-outer quadrant of left female breast: Secondary | ICD-10-CM | POA: Diagnosis not present

## 2021-10-14 DIAGNOSIS — Z20822 Contact with and (suspected) exposure to covid-19: Secondary | ICD-10-CM | POA: Diagnosis not present

## 2021-10-17 DIAGNOSIS — Z170 Estrogen receptor positive status [ER+]: Secondary | ICD-10-CM | POA: Diagnosis not present

## 2021-10-17 DIAGNOSIS — C50412 Malignant neoplasm of upper-outer quadrant of left female breast: Secondary | ICD-10-CM | POA: Diagnosis not present

## 2021-10-24 DIAGNOSIS — C50412 Malignant neoplasm of upper-outer quadrant of left female breast: Secondary | ICD-10-CM | POA: Diagnosis not present

## 2021-10-24 DIAGNOSIS — Z170 Estrogen receptor positive status [ER+]: Secondary | ICD-10-CM | POA: Diagnosis not present

## 2021-10-25 DIAGNOSIS — Z20822 Contact with and (suspected) exposure to covid-19: Secondary | ICD-10-CM | POA: Diagnosis not present

## 2021-11-03 DIAGNOSIS — Z170 Estrogen receptor positive status [ER+]: Secondary | ICD-10-CM | POA: Diagnosis not present

## 2021-11-03 DIAGNOSIS — C50412 Malignant neoplasm of upper-outer quadrant of left female breast: Secondary | ICD-10-CM | POA: Diagnosis not present

## 2021-11-29 ENCOUNTER — Ambulatory Visit (INDEPENDENT_AMBULATORY_CARE_PROVIDER_SITE_OTHER): Payer: Medicare Other

## 2021-11-29 ENCOUNTER — Other Ambulatory Visit: Payer: Self-pay | Admitting: Obstetrics and Gynecology

## 2021-11-29 DIAGNOSIS — M8589 Other specified disorders of bone density and structure, multiple sites: Secondary | ICD-10-CM | POA: Diagnosis not present

## 2021-11-29 DIAGNOSIS — Z13820 Encounter for screening for osteoporosis: Secondary | ICD-10-CM

## 2021-11-29 DIAGNOSIS — Z780 Asymptomatic menopausal state: Secondary | ICD-10-CM

## 2021-11-29 DIAGNOSIS — E2839 Other primary ovarian failure: Secondary | ICD-10-CM

## 2021-11-29 DIAGNOSIS — M8580 Other specified disorders of bone density and structure, unspecified site: Secondary | ICD-10-CM

## 2022-01-05 NOTE — Progress Notes (Signed)
[place] Phone: [phone] Subjective:   [name], am serving as a scribe for Dr. Hulan Saas. I'm seeing this patient by the request  of:  Laurey Morale, MD  CC: Bilateral hip pain left greater than right  [id number]  Stacy Gillespie is a 71 y.o. female coming in with complaint of B hip pain, L>R. Patient states that she has had cortisone injections in L side. Complains off radiating symptoms in L quad. Does not feel like they worked Patient has also done PT 2x which helped her R leg but not left hip. Patient states pain has been going on for 3 years and she was being treated at [facility]. Last injection over 1 year ago. Pain is constant. Uses Tylenol for pain relief.        Past Medical History:  Diagnosis Date   Arthritis    COPD (chronic obstructive pulmonary disease) ([place])    Hyperlipidemia    Osteopenia 07/2018   T score -1.3 FRAX 9% / 1.1%   Thyroid disease    Past Surgical History:  Procedure Laterality Date   BROKEN LEG     COLONOSCOPY  02-26-12   per Dr. Fuller Plan, clear, repeat in 10 yrs    NOSE SURGERY     Social History   Socioeconomic History   Marital status: Married    Spouse name: Not on file   Number of children: Not on file   Years of education: Not on file   Highest education level: Not on file  Occupational History   Not on file  Tobacco Use   Smoking status: Former    Packs/day: 0.50    Types: Cigarettes    Quit date: 07/07/2014    Years since quitting: 7.5   Smokeless tobacco: Never  Vaping Use   Vaping Use: Never used  Substance and Sexual Activity   Alcohol use: Yes    Alcohol/week: 0.0 standard drinks of alcohol    Comment: rare   Drug use: No   Sexual activity: Not Currently    Birth control/protection: Post-menopausal    Comment: 1st intercourse 71 yo-Fewer than 5 partners  Other Topics Concern   Not on file  Social History Narrative   Not  on file   Social Determinants of Health   Financial Resource Strain: Not on file  Food Insecurity: Not on file  Transportation Needs: Not on file  Physical Activity: Not on file  Stress: Not on file  Social Connections: Not on file   Allergies  Allergen Reactions   Chlorhexidine Gluconate     Swelling of tongue ( mouth rinse )   Clindamycin/Lincomycin Diarrhea   Red Dye Diarrhea and Nausea And Vomiting   Family History  Problem Relation Age of Onset   Arthritis Mother    Hypertension Mother    Arthritis Brother    Heart disease Brother    Colon cancer Maternal Grandfather 35   Colon cancer Paternal Grandfather 24   Heart disease Paternal Grandmother    Stomach cancer Maternal Grandmother 51   Breast cancer Neg Hx     Current Outpatient Medications (Endocrine & Metabolic):    levothyroxine (SYNTHROID) 100 MCG tablet, Take 1 tablet (100 mcg total) by mouth daily.  Current Outpatient Medications (Cardiovascular):    rosuvastatin (CRESTOR) 10 MG tablet, Take 1 tablet by mouth  daily  Current Outpatient Medications (Respiratory):    triamcinolone (NASACORT AQ) 55 MCG/ACT  AERO nasal inhaler, Place 2 sprays into the nose daily.    Current Outpatient Medications (Other):    Calcium Citrate-Vitamin D (CITRACAL + D PO), Take 1 tablet by mouth daily.    gabapentin (NEURONTIN) 100 MG capsule, Take 2 capsules (200 mg total) by mouth at bedtime.   triamcinolone cream (KENALOG) 0.1 %, Apply 1 application topically 2 (two) times daily.   VITAMIN D PO, Take by mouth. Taking with calcium and vitamin D combination tab.   Reviewed prior external information including notes and imaging from  primary care provider As well as notes that were available from care everywhere and other healthcare systems.  Past medical history, social, surgical and family history all reviewed in electronic medical record.  No pertanent information unless stated regarding to the chief complaint.   Review  of Systems:  No headache, visual changes, nausea, vomiting, diarrhea, constipation, dizziness, abdominal pain, skin rash, fevers, chills, night sweats, weight loss, swollen lymph nodes,joint swelling, chest pain, shortness of breath, mood changes. POSITIVE muscle aches, body aches  Objective  Blood pressure 124/82, pulse 81, height '5\' 5"'$  (1.651 m), weight 153 lb (69.4 kg), SpO2 97 %.   General: No apparent distress alert and oriented x3 mood and affect normal, dressed appropriately.  HEENT: Pupils equal, extraocular movements intact  Respiratory: Patient's speak in full sentences and does not appear short of breath  Cardiovascular: No lower extremity edema, non tender, no erythema  Bilateral hands to have some tenderness to palpation over the greater trochanteric area but does have tightness also noted of the straight leg test bilaterally.  Patient does have tightness in the gluteal areas.  Loss of lordosis in the lumbar spine. Some discomfort in the back with extension of the.  No midline tenderness.  Neurovascular intact distally.  [id number]; 15 additional minutes spent for Therapeutic exercises as stated in above notes.  This included exercises focusing on stretching, strengthening, with significant focus on eccentric aspects.   Long term goals include an improvement in range of motion, strength, endurance as well as avoiding reinjury. Patient's frequency would include in 1-2 times a day, 3-5 times a week for a duration of 6-12 weeks.  Low back exercises that included:  Pelvic tilt/bracing instruction to focus on control of the pelvic girdle and lower abdominal muscles  Glute strengthening exercises, focusing on proper firing of the glutes without engaging the low back muscles Proper stretching techniques for maximum relief for the hamstrings, hip flexors, low back and some rotation where tolerated Proper technique shown and discussed handout in great detail with ATC.  All questions were discussed  and answered.      Impression and Recommendations:

## 2022-01-06 ENCOUNTER — Encounter: Payer: Self-pay | Admitting: Family Medicine

## 2022-01-06 ENCOUNTER — Ambulatory Visit: Payer: Self-pay

## 2022-01-06 ENCOUNTER — Ambulatory Visit (INDEPENDENT_AMBULATORY_CARE_PROVIDER_SITE_OTHER): Payer: Medicare Other

## 2022-01-06 ENCOUNTER — Ambulatory Visit (INDEPENDENT_AMBULATORY_CARE_PROVIDER_SITE_OTHER): Payer: Medicare Other | Admitting: Family Medicine

## 2022-01-06 VITALS — BP 124/82 | HR 81 | Ht 65.0 in | Wt 153.0 lb

## 2022-01-06 DIAGNOSIS — M25551 Pain in right hip: Secondary | ICD-10-CM | POA: Diagnosis not present

## 2022-01-06 DIAGNOSIS — M25552 Pain in left hip: Secondary | ICD-10-CM

## 2022-01-06 DIAGNOSIS — R102 Pelvic and perineal pain: Secondary | ICD-10-CM | POA: Diagnosis not present

## 2022-01-06 DIAGNOSIS — M545 Low back pain, unspecified: Secondary | ICD-10-CM

## 2022-01-06 DIAGNOSIS — M7061 Trochanteric bursitis, right hip: Secondary | ICD-10-CM | POA: Diagnosis not present

## 2022-01-06 DIAGNOSIS — M4316 Spondylolisthesis, lumbar region: Secondary | ICD-10-CM | POA: Diagnosis not present

## 2022-01-06 DIAGNOSIS — M7062 Trochanteric bursitis, left hip: Secondary | ICD-10-CM | POA: Diagnosis not present

## 2022-01-06 MED ORDER — GABAPENTIN 100 MG PO CAPS: 200.0000 mg | ORAL_CAPSULE | Freq: Every day | ORAL | 0 refills | Status: DC

## 2022-01-06 NOTE — Patient Instructions (Addendum)
Xray today Tart cherry '1200mg'$  Do prescribed exercises at least 3x a week Vit D 2000iu Gabapentin 200 mg See you again in 8 weeks

## 2022-01-06 NOTE — Assessment & Plan Note (Signed)
Patient has been diagnosed with bursitis of both hips previously.  Concerned that patient is now having also more.  We will get x-rays.  Gabapentin given.  We will also get x-ray secondary to the history of breast cancer.  In addition to this patient we will see how she does with topical anti-inflammatories and home exercises.  Worsening pain in the morning consider the possibility of injections in the has been sometime.  Patient is in agreement with the plan and will follow-up with me again 6 to 8 weeks for further evaluation.

## 2022-01-07 ENCOUNTER — Other Ambulatory Visit: Payer: Self-pay | Admitting: Family Medicine

## 2022-01-09 ENCOUNTER — Other Ambulatory Visit: Payer: Self-pay

## 2022-02-06 ENCOUNTER — Other Ambulatory Visit: Payer: Self-pay | Admitting: Family Medicine

## 2022-02-10 ENCOUNTER — Telehealth: Payer: Self-pay | Admitting: Family Medicine

## 2022-02-10 NOTE — Telephone Encounter (Signed)
Spoke to patient to schedule Medicare Annual Wellness Visit (AWV) either virtually or in office. Left  my Herbie Drape number [phone]  She stated she has a lot going on   she will talk to dr fry when she sees him next week  Last WTM 01/05/14 ; please schedule at anytime with [facility] Nurse Health Advisor 1 or 2   This should be a 45 minute visit.

## 2022-02-14 DIAGNOSIS — Z87891 Personal history of nicotine dependence: Secondary | ICD-10-CM | POA: Diagnosis not present

## 2022-02-14 DIAGNOSIS — Z923 Personal history of irradiation: Secondary | ICD-10-CM | POA: Diagnosis not present

## 2022-02-14 DIAGNOSIS — Z170 Estrogen receptor positive status [ER+]: Secondary | ICD-10-CM | POA: Diagnosis not present

## 2022-02-14 DIAGNOSIS — Z9012 Acquired absence of left breast and nipple: Secondary | ICD-10-CM | POA: Diagnosis not present

## 2022-02-14 DIAGNOSIS — Z79810 Long term (current) use of selective estrogen receptor modulators (SERMs): Secondary | ICD-10-CM | POA: Diagnosis not present

## 2022-02-14 DIAGNOSIS — Z5181 Encounter for therapeutic drug level monitoring: Secondary | ICD-10-CM | POA: Diagnosis not present

## 2022-02-14 DIAGNOSIS — C50412 Malignant neoplasm of upper-outer quadrant of left female breast: Secondary | ICD-10-CM | POA: Diagnosis not present

## 2022-02-14 DIAGNOSIS — T451X5A Adverse effect of antineoplastic and immunosuppressive drugs, initial encounter: Secondary | ICD-10-CM | POA: Diagnosis not present

## 2022-02-14 DIAGNOSIS — R232 Flushing: Secondary | ICD-10-CM | POA: Diagnosis not present

## 2022-02-14 DIAGNOSIS — T386X5A Adverse effect of antigonadotrophins, antiestrogens, antiandrogens, not elsewhere classified, initial encounter: Secondary | ICD-10-CM | POA: Diagnosis not present

## 2022-02-21 ENCOUNTER — Ambulatory Visit (INDEPENDENT_AMBULATORY_CARE_PROVIDER_SITE_OTHER): Payer: Medicare Other | Admitting: Family Medicine

## 2022-02-21 ENCOUNTER — Encounter: Payer: Self-pay | Admitting: Family Medicine

## 2022-02-21 VITALS — BP 110/60 | HR 66 | Temp 98.4°F | Ht 65.0 in | Wt 151.2 lb

## 2022-02-21 DIAGNOSIS — J4489 Other specified chronic obstructive pulmonary disease: Secondary | ICD-10-CM

## 2022-02-21 DIAGNOSIS — J449 Chronic obstructive pulmonary disease, unspecified: Secondary | ICD-10-CM | POA: Diagnosis not present

## 2022-02-21 DIAGNOSIS — E559 Vitamin D deficiency, unspecified: Secondary | ICD-10-CM

## 2022-02-21 DIAGNOSIS — M7062 Trochanteric bursitis, left hip: Secondary | ICD-10-CM | POA: Diagnosis not present

## 2022-02-21 DIAGNOSIS — M159 Polyosteoarthritis, unspecified: Secondary | ICD-10-CM

## 2022-02-21 DIAGNOSIS — Z1211 Encounter for screening for malignant neoplasm of colon: Secondary | ICD-10-CM

## 2022-02-21 DIAGNOSIS — M7061 Trochanteric bursitis, right hip: Secondary | ICD-10-CM | POA: Diagnosis not present

## 2022-02-21 DIAGNOSIS — E039 Hypothyroidism, unspecified: Secondary | ICD-10-CM

## 2022-02-21 DIAGNOSIS — M1000 Idiopathic gout, unspecified site: Secondary | ICD-10-CM

## 2022-02-21 DIAGNOSIS — R739 Hyperglycemia, unspecified: Secondary | ICD-10-CM

## 2022-02-21 DIAGNOSIS — H8110 Benign paroxysmal vertigo, unspecified ear: Secondary | ICD-10-CM

## 2022-02-21 DIAGNOSIS — E782 Mixed hyperlipidemia: Secondary | ICD-10-CM

## 2022-02-21 DIAGNOSIS — I499 Cardiac arrhythmia, unspecified: Secondary | ICD-10-CM

## 2022-02-21 DIAGNOSIS — M150 Primary generalized (osteo)arthritis: Secondary | ICD-10-CM

## 2022-02-21 LAB — CBC WITH DIFFERENTIAL/PLATELET
Basophils Absolute: 0.1 10*3/uL (ref 0.0–0.1)
Basophils Relative: 0.8 % (ref 0.0–3.0)
Eosinophils Absolute: 0.1 10*3/uL (ref 0.0–0.7)
Eosinophils Relative: 0.7 % (ref 0.0–5.0)
HCT: 38.6 % (ref 36.0–46.0)
Hemoglobin: 12.9 g/dL (ref 12.0–15.0)
Lymphocytes Relative: 14.6 % (ref 12.0–46.0)
Lymphs Abs: 1 10*3/uL (ref 0.7–4.0)
MCHC: 33.6 g/dL (ref 30.0–36.0)
MCV: 91.1 fl (ref 78.0–100.0)
Monocytes Absolute: 0.5 10*3/uL (ref 0.1–1.0)
Monocytes Relative: 7.8 % (ref 3.0–12.0)
Neutro Abs: 5.3 10*3/uL (ref 1.4–7.7)
Neutrophils Relative %: 76.1 % (ref 43.0–77.0)
Platelets: 325 10*3/uL (ref 150.0–400.0)
RBC: 4.24 Mil/uL (ref 3.87–5.11)
RDW: 12.2 % (ref 11.5–15.5)
WBC: 7 10*3/uL (ref 4.0–10.5)

## 2022-02-21 LAB — T3, FREE: T3, Free: 2.3 pg/mL (ref 2.3–4.2)

## 2022-02-21 LAB — VITAMIN D 25 HYDROXY (VIT D DEFICIENCY, FRACTURES): VITD: 30.11 ng/mL (ref 30.00–100.00)

## 2022-02-21 LAB — LIPID PANEL
Cholesterol: 218 mg/dL — ABNORMAL HIGH (ref 0–200)
HDL: 71.8 mg/dL (ref >39.00)
LDL Cholesterol: 129 mg/dL — ABNORMAL HIGH (ref 0–99)
NonHDL: 146.07
Total CHOL/HDL Ratio: 3
Triglycerides: 86 mg/dL (ref 0.0–149.0)
VLDL: 17.2 mg/dL (ref 0.0–40.0)

## 2022-02-21 LAB — T4, FREE: Free T4: 1.26 ng/dL (ref 0.60–1.60)

## 2022-02-21 LAB — HEMOGLOBIN A1C: Hgb A1c MFr Bld: 5.7 % (ref 4.6–6.5)

## 2022-02-21 LAB — TSH: TSH: 0.99 u[IU]/mL (ref 0.35–5.50)

## 2022-02-21 MED ORDER — LEVOTHYROXINE SODIUM 100 MCG PO TABS
100.0000 ug | ORAL_TABLET | Freq: Every day | ORAL | 3 refills | Status: DC
Start: 2022-02-21 — End: 2023-02-23

## 2022-02-21 MED ORDER — ROSUVASTATIN CALCIUM 10 MG PO TABS: ORAL_TABLET | ORAL | 3 refills | Status: DC

## 2022-02-21 NOTE — Progress Notes (Signed)
Subjective:    Patient ID: Stacy Gillespie, female    DOB: May 07, 1951, 71 y.o.   MRN: [id number]  HPI Here to follow up on issues. She feels well except she asks about irregular heartbeats that started about 2 months ago. She describes these as "skipping" and sometimes they are a little faster then normal. They tend to last about one hour before they go away. She notices them mostly when she is still. Such as when lying in bed. No chest pain or SOB. Otherwise she sees the [facility] clinic regularly. She had labs drawn there on7-25-23 including normal electrolytes, normal renal function (creatinine was 1.0 and GFR was 59), and normal liver enzymes. She is due for a colonoscopy.    Review of Systems  Constitutional: Negative.   HENT: Negative.    Eyes: Negative.   Respiratory: Negative.    Cardiovascular:  Positive for palpitations.  Gastrointestinal: Negative.   Genitourinary:  Negative for decreased urine volume, difficulty urinating, dyspareunia, dysuria, enuresis, flank pain, frequency, hematuria, pelvic pain and urgency.  Musculoskeletal: Negative.   Skin: Negative.   Neurological: Negative.  Negative for headaches.  Psychiatric/Behavioral: Negative.         Objective:   Physical Exam Constitutional:      General: She is not in acute distress.    Appearance: Normal appearance. She is well-developed.  HENT:     Head: Normocephalic and atraumatic.     Right Ear: External ear normal.     Left Ear: External ear normal.     Nose: Nose normal.     Mouth/Throat:     Pharynx: No oropharyngeal exudate.  Eyes:     General: No scleral icterus.    Conjunctiva/sclera: Conjunctivae normal.     Pupils: Pupils are equal, round, and reactive to light.  Neck:     Thyroid: No thyromegaly.     Vascular: No JVD.  Cardiovascular:     Rate and Rhythm: Normal rate. Rhythm irregular.     Heart sounds: Normal heart sounds. No murmur heard.    No friction rub. No gallop.     Comments:  EKG today shows NSR  Pulmonary:     Effort: Pulmonary effort is normal. No respiratory distress.     Breath sounds: Normal breath sounds. No wheezing or rales.  Chest:     Chest wall: No tenderness.  Abdominal:     General: Bowel sounds are normal. There is no distension.     Palpations: Abdomen is soft. There is no mass.     Tenderness: There is no abdominal tenderness. There is no guarding or rebound.  Musculoskeletal:        General: No tenderness. Normal range of motion.     Cervical back: Normal range of motion and neck supple.  Lymphadenopathy:     Cervical: No cervical adenopathy.  Skin:    General: Skin is warm and dry.     Findings: No erythema or rash.  Neurological:     Mental Status: She is alert and oriented to person, place, and time.     Cranial Nerves: No cranial nerve deficit.     Motor: No abnormal muscle tone.     Coordination: Coordination normal.     Deep Tendon Reflexes: Reflexes are normal and symmetric. Reflexes normal.  Psychiatric:        Behavior: Behavior normal.        Thought Content: Thought content normal.        Judgment: Judgment  normal.           Assessment & Plan:  She is doing well after treatment for breast cancer. Her OA and vertigo are stable. Her COPD is stable. She is likely having some PAC's or PVC's. She will let us know if these get worse or if she develops symptoms from them. We will get fasting labs to check lipids, vitamin D, A1c, etc. We spent a total of ( 35  ) minutes reviewing records and discussing these issues.  Alysia Penna, MD

## 2022-03-02 NOTE — Progress Notes (Signed)
Stacy Gillespie Phone: [phone] Subjective:    I'm seeing this patient by the request  of:  Stacy Morale, MD  CC: back pain follow up   [id number]  01/06/2022       Patient has been diagnosed with bursitis of both hips previously.  Concerned that patient is now having also more.  We will get x-rays.  Gabapentin given.  We will also get x-ray secondary to the history of breast cancer.  In addition to this patient we will see how she does with topical anti-inflammatories and home exercises.  Worsening pain in the morning consider the possibility of injections in the has been sometime.  Patient is in agreement with the Gillespie and will follow-up with me again 6 to 8 weeks for further evaluation.      Update 03/03/2022 Stacy Gillespie is a 71 y.o. female coming in with complaint of B hip and LBP. Patient states that she is much better, she is not in pain, she is able to walk with no pain, gabapentin has worked really well   03/08/2022 Xray lumbar spine IMPRESSION: 1. Degenerative disc disease at L4-L5 and L5-S1. 2. Trace anterolisthesis of L3 on L4. Anterolisthesis of L4 on L5 on prior exam is no longer seen.    03/08/2022 Xray Pelvis IMPRESSION: Unremarkable radiograph of the pelvis.     Past Medical History:  Diagnosis Date   Arthritis    COPD (chronic obstructive pulmonary disease) ([place])    Hyperlipidemia    Osteopenia 07/2018   T score -1.3 FRAX 9% / 1.1%   Thyroid disease    Past Surgical History:  Procedure Laterality Date   BROKEN LEG     COLONOSCOPY  02-26-12   per Dr. Fuller Gillespie, clear, repeat in 10 yrs    NOSE SURGERY     Social History   Socioeconomic History   Marital status: Married    Spouse name: Not on file   Number of children: Not on file   Years of education: Not on file   Highest education level: Not on file  Occupational History   Not on file  Tobacco Use   Smoking status:  Former    Packs/day: 0.50    Types: Cigarettes    Quit date: 07/07/2014    Years since quitting: 7.6   Smokeless tobacco: Never  Vaping Use   Vaping Use: Never used  Substance and Sexual Activity   Alcohol use: Yes    Alcohol/week: 0.0 standard drinks of alcohol    Comment: rare   Drug use: No   Sexual activity: Not Currently    Birth control/protection: Post-menopausal    Comment: 1st intercourse 71 yo-Fewer than 5 partners  Other Topics Concern   Not on file  Social History Narrative   Not on file   Social Determinants of Health   Financial Resource Strain: Not on file  Food Insecurity: Not on file  Transportation Needs: Not on file  Physical Activity: Not on file  Stress: Not on file  Social Connections: Not on file   Allergies  Allergen Reactions   Chlorhexidine Gluconate     Swelling of tongue ( mouth rinse )   Clindamycin/Lincomycin Diarrhea   Red Dye Diarrhea and Nausea And Vomiting   Family History  Problem Relation Age of Onset   Arthritis Mother    Hypertension Mother    Arthritis Brother    Heart disease Brother  Colon cancer Maternal Grandfather 58   Colon cancer Paternal Grandfather 25   Heart disease Paternal Grandmother    Stomach cancer Maternal Grandmother 48   Breast cancer Neg Hx     Current Outpatient Medications (Endocrine & Metabolic):    levothyroxine (SYNTHROID) 100 MCG tablet, Take 1 tablet (100 mcg total) by mouth daily.  Current Outpatient Medications (Cardiovascular):    rosuvastatin (CRESTOR) 10 MG tablet, Take 1 tablet by mouth  daily  Current Outpatient Medications (Respiratory):    triamcinolone (NASACORT AQ) 55 MCG/ACT AERO nasal inhaler, Place 2 sprays into the nose daily.    Current Outpatient Medications (Other):    Calcium Citrate-Vitamin D (CITRACAL + D PO), Take 1 tablet by mouth daily.    gabapentin (NEURONTIN) 100 MG capsule, Take 2 capsules (200 mg total) by mouth at bedtime.   tamoxifen (NOLVADEX) 10 MG  tablet, Take 20 mg by mouth daily.   triamcinolone cream (KENALOG) 0.1 %, Apply 1 application topically 2 (two) times daily.   VITAMIN D PO, Take by mouth. Taking with calcium and vitamin D combination tab.    Review of Systems:  No headache, visual changes, nausea, vomiting, diarrhea, constipation, dizziness, abdominal pain, skin rash, fevers, chills, night sweats, weight loss, swollen lymph nodes, body aches, joint swelling, chest pain, shortness of breath, mood changes. POSITIVE muscle aches but improved   Objective  Blood pressure 110/70, pulse 91, height '5\' 5"'$  (1.651 m), weight 153 lb (69.4 kg), SpO2 99 %.   General: No apparent distress alert and oriented x3 mood and affect normal, dressed appropriately.  HEENT: Pupils equal, extraocular movements intact  Respiratory: Patient's speak in full sentences and does not appear short of breath  Cardiovascular: No lower extremity edema, non tender, no erythema  Back exam loss of lordosis  Hip exam tightness noted as well mild ttp over the left GT      Impression and Recommendations:    The above documentation has been reviewed and is accurate and complete Stacy Pulley, DO

## 2022-03-03 ENCOUNTER — Ambulatory Visit (INDEPENDENT_AMBULATORY_CARE_PROVIDER_SITE_OTHER): Payer: Medicare Other | Admitting: Family Medicine

## 2022-03-03 DIAGNOSIS — M7061 Trochanteric bursitis, right hip: Secondary | ICD-10-CM

## 2022-03-03 DIAGNOSIS — M7062 Trochanteric bursitis, left hip: Secondary | ICD-10-CM | POA: Diagnosis not present

## 2022-03-03 MED ORDER — GABAPENTIN 100 MG PO CAPS
200.0000 mg | ORAL_CAPSULE | Freq: Every day | ORAL | 0 refills | Status: DC
Start: 2022-03-03 — End: 2022-04-28

## 2022-03-03 NOTE — Assessment & Plan Note (Signed)
I do believe the patient does have some bursitis paralysis CC with patient responding extremely well to the gabapentin that there is some radicular aspect of the wrist.  Discussed with patient that no significant side effects that there is a possibility.  Increase to 300 mg if necessary.  Patient will continue the home exercises.  Patient states that it has been the first time in 3 years when she has been able to do yard work.  Follow-up with me again 2 to 3 months

## 2022-03-03 NOTE — Patient Instructions (Addendum)
Good to see you  Refill gabapentin can take 2-3 at night Keep doing exercises but let the pain guide you to not over do it  Tylenol up to 3 times a day  2 month follow up

## 2022-03-07 ENCOUNTER — Other Ambulatory Visit: Payer: Self-pay

## 2022-03-07 NOTE — Patient Outreach (Signed)
  Care Coordination   03/07/2022 Name: Stacy Gillespie MRN: [id number] DOB: 1951/02/06   Care Coordination Outreach Attempts:  An unsuccessful telephone outreach was attempted today to offer the patient information about available care coordination services as a benefit of their health plan.   Follow Up Plan:  Additional outreach attempts will be made to offer the patient care coordination information and services.   Encounter Outcome:  No Answer  Care Coordination Interventions Activated:  No   Care Coordination Interventions:  No, not indicated   Peter Garter RN, BSN,CCM, [facility] [phone]

## 2022-03-14 ENCOUNTER — Other Ambulatory Visit: Payer: Self-pay

## 2022-03-14 NOTE — Patient Outreach (Signed)
  Care Coordination   Initial Visit Note   03/14/2022 Name: Stacy Gillespie MRN: [id number] DOB: 09-01-1950  Stacy Gillespie is a 71 y.o. year old female who sees Laurey Morale, MD for primary care. I spoke with  Queen Blossom by phone today  What matters to the patients health and wellness today?  I don't really have any problems today. I just had physical with Dr. Sarajane Jews    Goals Addressed             This Visit's Progress    Care Coordination Activities - no follow up required       Care Coordination Interventions: Provided education to patient re: Annual Wellness Visit and importance of follow ups with provider Assessed social determinant of health barriers No further interventions needed Pt will call to schedule Annual Wellness Visit          SDOH assessments and interventions completed:  Yes  SDOH Interventions Today    Flowsheet Row Most Recent Value  SDOH Interventions   Food Insecurity Interventions Intervention Not Indicated  Transportation Interventions Intervention Not Indicated        Care Coordination Interventions Activated:  Yes  Care Coordination Interventions:  Yes, provided   Follow up plan: No further intervention required.   Encounter Outcome:  Pt. Visit Completed  Peter Garter RN, BSN,CCM, CDE Care Management Coordinator [facility] [phone]

## 2022-03-14 NOTE — Patient Instructions (Signed)
Visit Information  Thank you for taking time to visit with me today. Please don't hesitate to contact me if I can be of assistance to you.   Following are the goals we discussed today:   Goals Addressed             This Visit's Progress    Care Coordination Activities - no follow up required       Care Coordination Interventions: Provided education to patient re: Annual Wellness Visit and importance of follow ups with provider Assessed social determinant of health barriers No further interventions needed Pt will call to schedule Annual Wellness Visit           If you are experiencing a Mental Health or [place] or need someone to talk to, please call the Suicide and Crisis Lifeline: 988 call the [place] National Suicide Prevention Lifeline: (843)731-8709 or TTY: 934-859-2267 TTY 3607282517) to talk to a trained counselor call 1-800-273-TALK (toll free, 24 hour hotline) go to [facility] [address], [place] [phone]) call 911   Patient verbalizes understanding of instructions and care plan provided today and agrees to view in [place]. Active MyChart status and patient understanding of how to access instructions and care plan via MyChart confirmed with patient.     No further follow up required:    Peter Garter RN, Jackquline Denmark, [facility] [phone]

## 2022-04-04 ENCOUNTER — Encounter: Payer: Self-pay | Admitting: Gastroenterology

## 2022-04-20 ENCOUNTER — Ambulatory Visit (AMBULATORY_SURGERY_CENTER): Payer: Self-pay

## 2022-04-20 ENCOUNTER — Telehealth: Payer: Self-pay | Admitting: *Deleted

## 2022-04-20 VITALS — Ht 65.0 in | Wt 155.0 lb

## 2022-04-20 DIAGNOSIS — Z1211 Encounter for screening for malignant neoplasm of colon: Secondary | ICD-10-CM

## 2022-04-20 MED ORDER — PEG 3350-KCL-NA BICARB-NACL 420 G PO SOLR: 4000.0000 mL | Freq: Once | ORAL | 0 refills | Status: AC

## 2022-04-20 MED ORDER — PEG 3350-KCL-NA BICARB-NACL 420 G PO SOLR: 4000.0000 mL | Freq: Once | ORAL | 0 refills | Status: DC

## 2022-04-20 NOTE — Progress Notes (Signed)
No egg or soy allergy known to patient  No issues known to pt with past sedation with any surgeries or procedures Patient denies ever being told they had issues or difficulty with intubation  No FH of Malignant Hyperthermia Pt is not on diet pills Pt is not on  home 02  Pt is not on blood thinners  Pt denies issues with constipation  No A fib or A flutte

## 2022-04-20 NOTE — Telephone Encounter (Signed)
Dr. Fuller Plan,  This pt was seen in PV this am; her repeat screening colonoscopy is 05-18-22.  She has been treated for left breast CA in the past year- she had a lumpectomy with lymph node removed January 2023 and several rounds of radiation after that.  She is currently on Tamoxifem.  Per our PV protocol, we are to check with the MD if a patient has been treated with cancer within the past year to make sure ok to proceed.  Please advise.  Thanks, [employer]

## 2022-04-21 NOTE — Telephone Encounter (Signed)
noted 

## 2022-04-27 NOTE — Progress Notes (Signed)
[facility] [place] Phone: [phone] Subjective:   Stacy Gillespie, am serving as a scribe for Dr. Hulan Saas.  I'm seeing this patient by the request  of:  Stacy Morale, MD  CC: Bilateral hip pain  [id number]  03/03/2022 I do believe the patient does have some bursitis paralysis CC with patient responding extremely well to the gabapentin that there is some radicular aspect of the wrist.  Discussed with patient that Gillespie significant side effects that there is a possibility.  Increase to 300 mg if necessary.  Patient will continue the home exercises.  Patient states that it has been the first time in 3 years when she has been able to do yard work.  Follow-up with me again 2 to 3 months  Update 04/28/2022 Stacy Gillespie is a 71 y.o. female coming in with complaint of B hip pain. Patient states that she is progressing in right direction. Using reciprocal gait on stairs. When she goes to exercise class her pain is much better throughout the day.  Patient states that she has been able to do cleaning up around the house which is significantly better than usual.  This is taking less and less time.     Past Medical History:  Diagnosis Date   Allergy    Arthritis    Cancer ([place]) 05/2021   Breast   Cataract    COPD (chronic obstructive pulmonary disease) (HCC)    Hyperlipidemia    Osteopenia 07/2018   T score -1.3 FRAX 9% / 1.1%   Osteoporosis    Thyroid disease    Past Surgical History:  Procedure Laterality Date   BREAST BIOPSY Left 05/2021   BREAST LUMPECTOMY Left 08/16/2021   with lymph node dissection   BROKEN LEG     COLONOSCOPY  02/26/2012   per Dr. Fuller Plan, clear, repeat in 10 yrs    NOSE SURGERY     Social History   Socioeconomic History   Marital status: Married    Spouse name: Not on file   Number of children: Not on file   Years of education: Not on file   Highest education level: Not on file   Occupational History   Not on file  Tobacco Use   Smoking status: Former    Packs/day: 0.50    Types: Cigarettes    Quit date: 07/07/2014    Years since quitting: 7.8   Smokeless tobacco: Never  Vaping Use   Vaping Use: Never used  Substance and Sexual Activity   Alcohol use: Yes    Alcohol/week: 0.0 standard drinks of alcohol    Comment: rare   Drug use: Gillespie   Sexual activity: Not Currently    Birth control/protection: Post-menopausal    Comment: 1st intercourse 71 yo-Fewer than 5 partners  Other Topics Concern   Not on file  Social History Narrative   Not on file   Social Determinants of Health   Financial Resource Strain: Not on file  Food Insecurity: Gillespie Food Insecurity (03/14/2022)   Hunger Vital Sign    Worried About Running Out of Food in the Last Year: Never true    Ran Out of Food in the Last Year: Never true  Transportation Needs: Gillespie Transportation Needs (03/14/2022)   PRAPARE - Transportation    Lack of Transportation (Medical): Gillespie    Lack of Transportation (Non-Medical): Gillespie  Physical Activity: Not on file  Stress: Not on file  Social Connections: Not on file   Allergies  Allergen Reactions   Chlorhexidine Gluconate     Swelling of tongue ( mouth rinse )   Clindamycin/Lincomycin Diarrhea   Red Dye Diarrhea and Nausea And Vomiting   Family History  Problem Relation Age of Onset   Arthritis Mother    Hypertension Mother    Arthritis Brother    Heart disease Brother    Stomach cancer Maternal Grandmother 60   Colon cancer Maternal Grandfather 60   Heart disease Paternal Grandmother    Colon cancer Paternal Grandfather 37   Breast cancer Neg Hx    Colon polyps Neg Hx    Esophageal cancer Neg Hx    Rectal cancer Neg Hx     Current Outpatient Medications (Endocrine & Metabolic):    levothyroxine (SYNTHROID) 100 MCG tablet, Take 1 tablet (100 mcg total) by mouth daily.  Current Outpatient Medications (Cardiovascular):    rosuvastatin (CRESTOR) 10 MG  tablet, Take 1 tablet by mouth  daily  Current Outpatient Medications (Respiratory):    triamcinolone (NASACORT AQ) 55 MCG/ACT AERO nasal inhaler, Place 2 sprays into the nose daily.  Current Outpatient Medications (Analgesics):    acetaminophen (TYLENOL) 325 MG tablet, Take by mouth.   Current Outpatient Medications (Other):    Calcium Carbonate-Vit D-Min (CALTRATE 600+D PLUS PO), Take by mouth.   Calcium Citrate-Vitamin D (CITRACAL + D PO), Take 1 tablet by mouth daily.   gabapentin (NEURONTIN) 100 MG capsule, Take 2 capsules (200 mg total) by mouth 2 (two) times daily.   tamoxifen (NOLVADEX) 10 MG tablet, Take 20 mg by mouth daily.   triamcinolone cream (KENALOG) 0.1 %, Apply 1 application topically 2 (two) times daily.   VITAMIN D PO, Take by mouth. Taking with calcium and vitamin D combination tab.    Review of Systems:  Gillespie headache, visual changes, nausea, vomiting, diarrhea, constipation, dizziness, abdominal pain, skin rash, fevers, chills, night sweats, weight loss, swollen lymph nodes, body aches, joint swelling, chest pain, shortness of breath, mood changes. POSITIVE muscle aches  Objective  Blood pressure 112/62, pulse 73, height '5\' 5"'$  (1.651 m), weight 154 lb (69.9 kg), SpO2 99 %.   General: Gillespie apparent distress alert and oriented x3 mood and affect normal, dressed appropriately.  HEENT: Pupils equal, extraocular movements intact  Respiratory: Patient's speak in full sentences and does not appear short of breath  Cardiovascular: Gillespie lower extremity edema, non tender, Gillespie erythema  Patient is sitting comfortably overall.  Patient though has some very mild tenderness noted with FABER test bilaterally.  Some tenderness also to palpation over the greater trochanteric area.  Patient was able to get out of his seat much quicker than usual better balance as well with walking.    Impression and Recommendations:    The above documentation has been reviewed and is accurate and  complete Lyndal Pulley, DO

## 2022-04-28 ENCOUNTER — Ambulatory Visit (INDEPENDENT_AMBULATORY_CARE_PROVIDER_SITE_OTHER): Payer: Medicare Other | Admitting: Family Medicine

## 2022-04-28 ENCOUNTER — Encounter: Payer: Self-pay | Admitting: Family Medicine

## 2022-04-28 DIAGNOSIS — M7061 Trochanteric bursitis, right hip: Secondary | ICD-10-CM | POA: Diagnosis not present

## 2022-04-28 DIAGNOSIS — M7062 Trochanteric bursitis, left hip: Secondary | ICD-10-CM | POA: Diagnosis not present

## 2022-04-28 MED ORDER — GABAPENTIN 100 MG PO CAPS: 200.0000 mg | ORAL_CAPSULE | Freq: Two times a day (BID) | ORAL | 1 refills | Status: DC

## 2022-04-28 NOTE — Patient Instructions (Addendum)
Gabapentin '200mg'$  2x a day Continue to be active See me again in 3-4 months

## 2022-04-28 NOTE — Assessment & Plan Note (Signed)
Patient has made significant progress with the gabapentin and continuing to stay active. At this point I would like to give patient Tylenol more gabapentin to 200 mg up to twice a day if needed.  Patient can either try to go up to 300 at night and 100 during the day if necessary.  We discussed continuing home exercises.  Patient has made significant improvement but states that this is the most activity she has done in over 3 years.  States that the relationships are better at the moment including with her husband.  Follow-up with me again in 3 to 4 months

## 2022-05-01 ENCOUNTER — Ambulatory Visit (INDEPENDENT_AMBULATORY_CARE_PROVIDER_SITE_OTHER): Payer: Medicare Other

## 2022-05-01 DIAGNOSIS — Z23 Encounter for immunization: Secondary | ICD-10-CM | POA: Diagnosis not present

## 2022-05-13 ENCOUNTER — Encounter: Payer: Self-pay | Admitting: Certified Registered Nurse Anesthetist

## 2022-05-18 ENCOUNTER — Ambulatory Visit (AMBULATORY_SURGERY_CENTER): Payer: Medicare Other | Admitting: Gastroenterology

## 2022-05-18 ENCOUNTER — Encounter: Payer: Self-pay | Admitting: Gastroenterology

## 2022-05-18 VITALS — BP 136/56 | HR 71 | Temp 98.2°F | Resp 16 | Ht 65.0 in | Wt 155.0 lb

## 2022-05-18 DIAGNOSIS — Z1211 Encounter for screening for malignant neoplasm of colon: Secondary | ICD-10-CM | POA: Diagnosis not present

## 2022-05-18 DIAGNOSIS — D123 Benign neoplasm of transverse colon: Secondary | ICD-10-CM | POA: Diagnosis not present

## 2022-05-18 DIAGNOSIS — J449 Chronic obstructive pulmonary disease, unspecified: Secondary | ICD-10-CM | POA: Diagnosis not present

## 2022-05-18 HISTORY — PX: COLONOSCOPY: SHX174

## 2022-05-18 MED ORDER — SODIUM CHLORIDE 0.9 % IV SOLN: 500.0000 mL | Freq: Once | INTRAVENOUS | Status: DC

## 2022-05-18 NOTE — Progress Notes (Signed)
History & Physical  Primary Care Physician:  Laurey Morale, MD Primary Gastroenterologist: Lucio Edward, MD  CHIEF COMPLAINT:  CRC screening  HPI: ASAL TEAS is a 71 y.o. female here for average risk CRC screening with colonoscopy.   Past Medical History:  Diagnosis Date   Allergy    Arthritis    Cancer ([place]) 05/2021   Breast   Cataract    COPD (chronic obstructive pulmonary disease) (HCC)    Hyperlipidemia    Osteopenia 07/2018   T score -1.3 FRAX 9% / 1.1%   Osteoporosis    Thyroid disease     Past Surgical History:  Procedure Laterality Date   BREAST BIOPSY Left 05/2021   BREAST LUMPECTOMY Left 08/16/2021   with lymph node dissection   BROKEN LEG     COLONOSCOPY  02/26/2012   per Dr. Fuller Plan, clear, repeat in 10 yrs    NOSE SURGERY      Prior to Admission medications   Medication Sig Start Date End Date Taking? Authorizing Provider  acetaminophen (TYLENOL) 325 MG tablet Take by mouth.   Yes [provider]  Calcium Carbonate-Vit D-Min (CALTRATE 600+D PLUS PO) Take by mouth.   Yes [provider]  Calcium Citrate-Vitamin D (CITRACAL + D PO) Take 1 tablet by mouth daily.   Yes [provider]  gabapentin (NEURONTIN) 100 MG capsule Take 2 capsules (200 mg total) by mouth 2 (two) times daily. 04/28/22  Yes Lyndal Pulley, DO  levothyroxine (SYNTHROID) 100 MCG tablet Take 1 tablet (100 mcg total) by mouth daily. 02/21/22  Yes Laurey Morale, MD  rosuvastatin (CRESTOR) 10 MG tablet Take 1 tablet by mouth  daily 02/21/22  Yes Laurey Morale, MD  tamoxifen (NOLVADEX) 10 MG tablet Take 20 mg by mouth daily. 02/11/22  Yes [provider]  triamcinolone (NASACORT AQ) 55 MCG/ACT AERO nasal inhaler Place 2 sprays into the nose daily. 01/21/19  Yes Laurey Morale, MD  triamcinolone cream (KENALOG) 0.1 % Apply 1 application topically 2 (two) times daily. 02/01/21  Yes Laurey Morale, MD  VITAMIN D PO Take by mouth. Taking with calcium and  vitamin D combination tab.   Yes [provider]    Current Outpatient Medications  Medication Sig Dispense Refill   acetaminophen (TYLENOL) 325 MG tablet Take by mouth.     Calcium Carbonate-Vit D-Min (CALTRATE 600+D PLUS PO) Take by mouth.     Calcium Citrate-Vitamin D (CITRACAL + D PO) Take 1 tablet by mouth daily.     gabapentin (NEURONTIN) 100 MG capsule Take 2 capsules (200 mg total) by mouth 2 (two) times daily. 360 capsule 1   levothyroxine (SYNTHROID) 100 MCG tablet Take 1 tablet (100 mcg total) by mouth daily. 90 tablet 3   rosuvastatin (CRESTOR) 10 MG tablet Take 1 tablet by mouth  daily 90 tablet 3   tamoxifen (NOLVADEX) 10 MG tablet Take 20 mg by mouth daily.     triamcinolone (NASACORT AQ) 55 MCG/ACT AERO nasal inhaler Place 2 sprays into the nose daily. 3 Inhaler 3   triamcinolone cream (KENALOG) 0.1 % Apply 1 application topically 2 (two) times daily. 45 g 3   VITAMIN D PO Take by mouth. Taking with calcium and vitamin D combination tab.     Current Facility-Administered Medications  Medication Dose Route Frequency Provider Last Rate Last Admin   0.9 %  sodium chloride infusion  500 mL Intravenous Once Ladene Artist, MD  Allergies as of 05/18/2022 - Review Complete 05/18/2022  Allergen Reaction Noted   Chlorhexidine gluconate  06/29/2015   Clindamycin/lincomycin Diarrhea 06/29/2015   Red dye Diarrhea and Nausea And Vomiting 12/22/2010    Family History  Problem Relation Age of Onset   Arthritis Mother    Hypertension Mother    Arthritis Brother    Heart disease Brother    Stomach cancer Maternal Grandmother 60   Colon cancer Maternal Grandfather 24   Heart disease Paternal Grandmother    Colon cancer Paternal Grandfather 70   Breast cancer Neg Hx    Colon polyps Neg Hx    Esophageal cancer Neg Hx    Rectal cancer Neg Hx     Social History   Socioeconomic History   Marital status: Married    Spouse name: Not on file   Number of  children: Not on file   Years of education: Not on file   Highest education level: Not on file  Occupational History   Not on file  Tobacco Use   Smoking status: Former    Packs/day: 0.50    Types: Cigarettes    Quit date: 07/07/2014    Years since quitting: 7.8   Smokeless tobacco: Never  Vaping Use   Vaping Use: Never used  Substance and Sexual Activity   Alcohol use: Yes    Alcohol/week: 0.0 standard drinks of alcohol    Comment: rare   Drug use: No   Sexual activity: Not Currently    Birth control/protection: Post-menopausal    Comment: 1st intercourse 71 yo-Fewer than 5 partners  Other Topics Concern   Not on file  Social History Narrative   Not on file   Social Determinants of Health   Financial Resource Strain: Not on file  Food Insecurity: No Food Insecurity (03/14/2022)   Hunger Vital Sign    Worried About Running Out of Food in the Last Year: Never true    Ran Out of Food in the Last Year: Never true  Transportation Needs: No Transportation Needs (03/14/2022)   PRAPARE - Transportation    Lack of Transportation (Medical): No    Lack of Transportation (Non-Medical): No  Physical Activity: Not on file  Stress: Not on file  Social Connections: Not on file  Intimate Partner Violence: Not on file    Review of Systems:  All systems reviewed were negative except where noted in HPI.   Physical Exam: General:  Alert, well-developed, in NAD Head:  Normocephalic and atraumatic. Eyes:  Sclera clear, no icterus.   Conjunctiva pink. Ears:  Normal auditory acuity. Mouth:  No deformity or lesions.  Neck:  Supple; no masses . Lungs:  Clear throughout to auscultation.   No wheezes, crackles, or rhonchi. No acute distress. Heart:  Regular rate and rhythm; no murmurs. Abdomen:  Soft, nondistended, nontender. No masses, hepatomegaly. No obvious masses.  Normal bowel .    Rectal:  Deferred   Msk:  Symmetrical without gross deformities.. Pulses:  Normal pulses  noted. Extremities:  Without edema. Neurologic:  Alert and  oriented x4;  grossly normal neurologically. Skin:  Intact without significant lesions or rashes. Cervical Nodes:  No significant cervical adenopathy. Psych:  Alert and cooperative. Normal mood and affect.   Impression / Plan:   Average risk CRC screening for colonoscopy.  Pricilla Riffle. Fuller Plan  05/18/2022, 10:35 AM See Shea Evans, [facility], to contact our on call provider

## 2022-05-18 NOTE — Progress Notes (Signed)
Called to room to assist during endoscopic procedure.  Patient ID and intended procedure confirmed with present staff. Received instructions for my participation in the procedure from the performing physician.  

## 2022-05-18 NOTE — Progress Notes (Signed)
Report given to PACU, vss 

## 2022-05-18 NOTE — Op Note (Signed)
[facility] Patient Name: Stacy Gillespie Procedure Date: 05/18/2022 10:39 AM MRN: [id number] Endoscopist: Ladene Artist , MD, [id number] Age: 71 Referring MD:  Date of Birth: [birth year] Gender: Female Account #: [id number] Procedure:                Colonoscopy Indications:              Screening for colorectal malignant neoplasm Medicines:                Monitored Anesthesia Care Procedure:                Pre-Anesthesia Assessment:                           - Prior to the procedure, a History and Physical                            was performed, and patient medications and                            allergies were reviewed. The patient's tolerance of                            previous anesthesia was also reviewed. The risks                            and benefits of the procedure and the sedation                            options and risks were discussed with the patient.                            All questions were answered, and informed consent                            was obtained. Prior Anticoagulants: The patient has                            taken no anticoagulant or antiplatelet agents. ASA                            Grade Assessment: III - A patient with severe                            systemic disease. After reviewing the risks and                            benefits, the patient was deemed in satisfactory                            condition to undergo the procedure.                           After obtaining informed consent, the colonoscope  was passed under direct vision. Throughout the                            procedure, the patient's blood pressure, pulse, and                            oxygen saturations were monitored continuously. The                            Olympus CF-HQ190L ([id number]) Colonoscope was                            introduced through the anus and advanced to the the                            cecum,  identified by appendiceal orifice and                            ileocecal valve. The ileocecal valve, appendiceal                            orifice, and rectum were photographed. The quality                            of the bowel preparation was good. The colonoscopy                            was performed without difficulty. The patient                            tolerated the procedure well. Scope In: 10:46:13 AM Scope Out: 11:01:57 AM Scope Withdrawal Time: 0 hours 11 minutes 16 seconds  Total Procedure Duration: 0 hours 15 minutes 44 seconds  Findings:                 The perianal and digital rectal examinations were                            normal.                           A 7 mm polyp was found in the distal transverse                            colon. The polyp was sessile. The polyp was removed                            with a cold snare. Resection and retrieval were                            complete.                           Anal papilla(e) were hypertrophied.  Internal hemorrhoids were found during                            retroflexion. The hemorrhoids were small and Grade                            I (internal hemorrhoids that do not prolapse).                           The exam was otherwise without abnormality on                            direct and retroflexion views. Complications:            No immediate complications. Estimated blood loss:                            None. Estimated Blood Loss:     Estimated blood loss: none. Impression:               - One 7 mm polyp in the distal transverse colon,                            removed with a cold snare. Resected and retrieved.                           - Anal papilla(e) were hypertrophied.                           - Internal hemorrhoids.                           - The examination was otherwise normal on direct                            and retroflexion views. Recommendation:            - Repeat colonoscopy after studies are complete for                            surveillance based on pathology results.                           - Patient has a contact number available for                            emergencies. The signs and symptoms of potential                            delayed complications were discussed with the                            patient. Return to normal activities tomorrow.                            Written discharge instructions were provided to the  patient.                           - Resume previous diet.                           - Continue present medications.                           - Await pathology results. Ladene Artist, MD 05/18/2022 11:04:48 AM This report has been signed electronically.

## 2022-05-18 NOTE — Patient Instructions (Signed)
Please read handouts provided. Continue present medications. Await pathology results.   YOU HAD AN ENDOSCOPIC PROCEDURE TODAY AT THE [facility]:   Refer to the procedure report that was given to you for any specific questions about what was found during the examination.  If the procedure report does not answer your questions, please call your gastroenterologist to clarify.  If you requested that your care partner not be given the details of your procedure findings, then the procedure report has been included in a sealed envelope for you to review at your convenience later.  YOU SHOULD EXPECT: Some feelings of bloating in the abdomen. Passage of more gas than usual.  Walking can help get rid of the air that was put into your GI tract during the procedure and reduce the bloating. If you had a lower endoscopy (such as a colonoscopy or flexible sigmoidoscopy) you may notice spotting of blood in your stool or on the toilet paper. If you underwent a bowel prep for your procedure, you may not have a normal bowel movement for a few days.  Please Note:  You might notice some irritation and congestion in your nose or some drainage.  This is from the oxygen used during your procedure.  There is no need for concern and it should clear up in a day or so.  SYMPTOMS TO REPORT IMMEDIATELY:  Following lower endoscopy (colonoscopy or flexible sigmoidoscopy):  Excessive amounts of blood in the stool  Significant tenderness or worsening of abdominal pains  Swelling of the abdomen that is new, acute  Fever of 100F or higher  For urgent or emergent issues, a gastroenterologist can be reached at any hour by calling [phone]. Do not use MyChart messaging for urgent concerns.    DIET:  We do recommend a small meal at first, but then you may proceed to your regular diet.  Drink plenty of fluids but you should avoid alcoholic beverages for 24 hours.  ACTIVITY:  You should plan to take it easy for  the rest of today and you should NOT DRIVE or use heavy machinery until tomorrow (because of the sedation medicines used during the test).    FOLLOW UP: Our staff will call the number listed on your records the next business day following your procedure.  We will call around 7:15- 8:00 am to check on you and address any questions or concerns that you may have regarding the information given to you following your procedure. If we do not reach you, we will leave a message.     If any biopsies were taken you will be contacted by phone or by letter within the next 1-3 weeks.  Please call us at [phone] if you have not heard about the biopsies in 3 weeks.    SIGNATURES/CONFIDENTIALITY: You and/or your care partner have signed paperwork which will be entered into your electronic medical record.  These signatures attest to the fact that that the information above on your After Visit Summary has been reviewed and is understood.  Full responsibility of the confidentiality of this discharge information lies with you and/or your care-partner. 

## 2022-05-18 NOTE — Progress Notes (Signed)
Pt's states no medical or surgical changes since previsit or office visit. 

## 2022-05-19 ENCOUNTER — Telehealth: Payer: Self-pay

## 2022-05-19 NOTE — Telephone Encounter (Signed)
  Follow up Call-     05/18/2022   10:17 AM  Call back number  Post procedure Call Back phone  # [phone]  Permission to leave phone message Yes     Patient questions:  Do you have a fever, pain , or abdominal swelling? No. Pain Score  0 *  Have you tolerated food without any problems? Yes.    Have you been able to return to your normal activities? Yes.    Do you have any questions about your discharge instructions: Diet   No. Medications  No. Follow up visit  No.  Do you have questions or concerns about your Care? No.  Actions: * If pain score is 4 or above: No action needed, pain <4.

## 2022-06-01 ENCOUNTER — Encounter: Payer: Self-pay | Admitting: Gastroenterology

## 2022-06-05 DIAGNOSIS — T451X5A Adverse effect of antineoplastic and immunosuppressive drugs, initial encounter: Secondary | ICD-10-CM | POA: Diagnosis not present

## 2022-06-05 DIAGNOSIS — R232 Flushing: Secondary | ICD-10-CM | POA: Diagnosis not present

## 2022-06-05 DIAGNOSIS — C50412 Malignant neoplasm of upper-outer quadrant of left female breast: Secondary | ICD-10-CM | POA: Diagnosis not present

## 2022-06-05 DIAGNOSIS — Z79810 Long term (current) use of selective estrogen receptor modulators (SERMs): Secondary | ICD-10-CM | POA: Diagnosis not present

## 2022-06-05 DIAGNOSIS — Z853 Personal history of malignant neoplasm of breast: Secondary | ICD-10-CM | POA: Diagnosis not present

## 2022-06-05 DIAGNOSIS — C50812 Malignant neoplasm of overlapping sites of left female breast: Secondary | ICD-10-CM | POA: Diagnosis not present

## 2022-06-05 DIAGNOSIS — Z170 Estrogen receptor positive status [ER+]: Secondary | ICD-10-CM | POA: Diagnosis not present

## 2022-06-05 DIAGNOSIS — R92333 Mammographic heterogeneous density, bilateral breasts: Secondary | ICD-10-CM | POA: Diagnosis not present

## 2022-07-07 ENCOUNTER — Telehealth (INDEPENDENT_AMBULATORY_CARE_PROVIDER_SITE_OTHER): Payer: Medicare Other | Admitting: Family Medicine

## 2022-07-07 ENCOUNTER — Encounter: Payer: Self-pay | Admitting: Family Medicine

## 2022-07-07 VITALS — Ht 65.0 in

## 2022-07-07 DIAGNOSIS — H8110 Benign paroxysmal vertigo, unspecified ear: Secondary | ICD-10-CM

## 2022-07-07 DIAGNOSIS — J069 Acute upper respiratory infection, unspecified: Secondary | ICD-10-CM

## 2022-07-07 NOTE — Progress Notes (Addendum)
Virtual Visit via Telephone Note I connected with Stacy Gillespie on 07/07/22 at  4:30 PM EST by telephone and verified that I am speaking with the correct person using two identifiers.   I discussed the limitations, risks, security and privacy concerns of performing an evaluation and management service by telephone and the availability of in person appointments. I also discussed with the patient that there may be a patient responsible charge related to this service. The patient expressed understanding and agreed to proceed.  Location patient: home Location provider: work office Participants present for the call: patient, provider Patient did not have a visit in the prior 7 days to address this/these issue(s).  Chief Complaint  Patient presents with   Cough   Sore Throat       History of Present Illness: Stacy Gillespie is a 71 y.o.female with hx of hypothyroidism, HLD,vertigo, COPD,and breast cancer of non productive cough and sore throat for the past five days.  Monday she started with mild sore throat, fatigue,chills,night sweats. She felt better 2 days ago. Sore throat has resolved and cough has greatly improved.  She reports having started taking Tylenol every four hours and using throat lozenges for symptom relief.   2 days ago she started having severe dizziness with room spinning upon turning over in bed. She tries to get up slowly to prevent dizziness.  She also reports nasal congestion and "crackling" feeling in her ears, but denies earache, ear drainage,hearing changes, vomiting, changes in bowel habits, or abdominal pain. Mild nausea when symptoms started.  She has a history of vertigo, but she states that this episode is different as it occurs after lying down. She denies experiencing any unusual headache, numbness, tingling, chest pain,diaphoresis, or palpitations.   She has been using Nasacort nasal spray and reports some blood when blowing her nose.  She has not done  a home COVID-19 tests and no known sick contact.  Observations/Objective: Patient sounds cheerful and well on the phone. I do not appreciate any SOB. Speech and thought processing are grossly intact. Patient reported vitals:Ht '5\' 5"'$  (1.651 m)   BMI 25.79 kg/m   Assessment and Plan: Stacy Gillespie is a 71 yo female c/o respiratory symptom and dizziness.  Benign paroxysmal positional vertigo, unspecified laterality We discussed other possible etiologies of dizziness, Hx today suggest benign vertigo. I do not think further work-up is necessary at this time. Explained that problem can be recurrent. Fall prevention. Meclizine 25 mg side effects discussed, she prefers to avoid new medications. Instructed about warning signs. F/U as needed.  URI, acute Symptoms suggests a viral etiology, improving. Continue symptomatic treatment. Instructed to monitor for signs of complications, including new onset of fever among some, instructed about warning signs. Cough and nasal congestion can last a few days and sometimes weeks. Plain mucinex may help. F/U as needed.  Follow Up Instructions:  Return if symptoms worsen or fail to improve.  I did not refer this patient for an OV in the next 24 hours for this/these issue(s).  I discussed the assessment and treatment plan with the patient. The patient was provided an opportunity to ask questions and all were answered. The patient agreed with the plan and demonstrated an understanding of the instructions.   The patient was advised to call back or seek an in-person evaluation if the symptoms worsen or if the condition fails to improve as anticipated.  I provided 14 minutes of non-face-to-face time during this encounter. Beuna Bolding G. Martinique, MD  [place]. [place] office.

## 2022-08-30 NOTE — Progress Notes (Unsigned)
Stacy Gillespie [address] [place] Phone: [phone] Subjective:   Stacy Gillespie, am serving as a scribe for Dr. Hulan Saas.  I'm seeing this patient by the request  of:  Laurey Morale, MD  CC:   [id number]  04/28/2022 Patient has made significant progress with the gabapentin and continuing to stay active. At this point I would like to give patient Tylenol more gabapentin to 200 mg up to twice a day if needed.  Patient can either try to go up to 300 at night and 100 during the day if necessary.  We discussed continuing home exercises.  Patient has made significant improvement but states that this is the most activity she has done in over 3 years.  States that the relationships are better at the moment including with her husband.  Follow-up with me again in 3 to 4 months      Update 08/31/2022 Stacy Gillespie is a 72 y.o. female coming in with complaint of B hip pain. Patient states doing well. Taking '200mg'$  gabapentin, never increased to '300mg'$ .  Patient has been able to increase activity, and daily activities such as going to the store she no longer has to take a break.  Feels like she is making progress overall.      Past Medical History:  Diagnosis Date   Allergy    Arthritis    Cancer ([place]) 05/2021   Breast   Cataract    COPD (chronic obstructive pulmonary disease) (HCC)    Hyperlipidemia    Osteopenia 07/2018   T score -1.3 FRAX 9% / 1.1%   Osteoporosis    Thyroid disease    Past Surgical History:  Procedure Laterality Date   BREAST BIOPSY Left 05/2021   BREAST LUMPECTOMY Left 08/16/2021   with lymph node dissection   BROKEN LEG     COLONOSCOPY  02/26/2012   per Dr. Fuller Plan, clear, repeat in 10 yrs    NOSE SURGERY     Social History   Socioeconomic History   Marital status: Married    Spouse name: Not on file   Number of children: Not on file   Years of education: Not on file   Highest education level: Not  on file  Occupational History   Not on file  Tobacco Use   Smoking status: Former    Packs/day: 0.50    Types: Cigarettes    Quit date: 07/07/2014    Years since quitting: 8.1   Smokeless tobacco: Never  Vaping Use   Vaping Use: Never used  Substance and Sexual Activity   Alcohol use: Yes    Alcohol/week: 0.0 standard drinks of alcohol    Comment: rare   Drug use: No   Sexual activity: Not Currently    Birth control/protection: Post-menopausal    Comment: 1st intercourse 72 yo-Fewer than 5 partners  Other Topics Concern   Not on file  Social History Narrative   Not on file   Social Determinants of Health   Financial Resource Strain: Not on file  Food Insecurity: No Food Insecurity (03/14/2022)   Hunger Vital Sign    Worried About Running Out of Food in the Last Year: Never true    Ran Out of Food in the Last Year: Never true  Transportation Needs: No Transportation Needs (03/14/2022)   PRAPARE - Transportation    Lack of Transportation (Medical): No    Lack of Transportation (Non-Medical): No  Physical Activity: Not  on file  Stress: Not on file  Social Connections: Not on file   Allergies  Allergen Reactions   Chlorhexidine Gluconate     Swelling of tongue ( mouth rinse )   Clindamycin/Lincomycin Diarrhea   Red Dye Diarrhea and Nausea And Vomiting   Family History  Problem Relation Age of Onset   Arthritis Mother    Hypertension Mother    Arthritis Brother    Heart disease Brother    Stomach cancer Maternal Grandmother 60   Colon cancer Maternal Grandfather 73   Heart disease Paternal Grandmother    Colon cancer Paternal Grandfather 27   Breast cancer Neg Hx    Colon polyps Neg Hx    Esophageal cancer Neg Hx    Rectal cancer Neg Hx     Current Outpatient Medications (Endocrine & Metabolic):    levothyroxine (SYNTHROID) 100 MCG tablet, Take 1 tablet (100 mcg total) by mouth daily.  Current Outpatient Medications (Cardiovascular):    rosuvastatin  (CRESTOR) 10 MG tablet, Take 1 tablet by mouth  daily  Current Outpatient Medications (Respiratory):    triamcinolone (NASACORT AQ) 55 MCG/ACT AERO nasal inhaler, Place 2 sprays into the nose daily.  Current Outpatient Medications (Analgesics):    acetaminophen (TYLENOL) 325 MG tablet, Take by mouth.   Current Outpatient Medications (Other):    Calcium Carbonate-Vit D-Min (CALTRATE 600+D PLUS PO), Take by mouth.   Calcium Citrate-Vitamin D (CITRACAL + D PO), Take 1 tablet by mouth daily.   gabapentin (NEURONTIN) 100 MG capsule, Take 2 capsules (200 mg total) by mouth 2 (two) times daily.   tamoxifen (NOLVADEX) 10 MG tablet, Take 20 mg by mouth daily.   triamcinolone cream (KENALOG) 0.1 %, Apply 1 application topically 2 (two) times daily.   VITAMIN D PO, Take by mouth. Taking with calcium and vitamin D combination tab.    Review of Systems:  No headache, visual changes, nausea, vomiting, diarrhea, constipation, dizziness, abdominal pain, skin rash, fevers, chills, night sweats, weight loss, swollen lymph nodes, body aches, joint swelling, chest pain, shortness of breath, mood changes. POSITIVE muscle aches but improvement  Objective  Blood pressure 132/76, pulse 87, height '5\' 5"'$  (1.651 m), weight 148 lb (67.1 kg), SpO2 95 %.   General: No apparent distress alert and oriented x3 mood and affect normal, dressed appropriately.  HEENT: Pupils equal, extraocular movements intact  Respiratory: Patient's speak in full sentences and does not appear short of breath  Cardiovascular: No lower extremity edema, non tender, no erythema  Low back exam does have some loss of lordosis.  Severe tightness still noted with straight leg test but no true radicular symptoms on the left side.  4-5 strength in lower extremities but symmetric to the contralateral side.  Significant tightness with FABER test bilaterally but is still left greater than right    Impression and Recommendations:     The above  documentation has been reviewed and is accurate and complete Lyndal Pulley, DO

## 2022-08-31 ENCOUNTER — Ambulatory Visit (INDEPENDENT_AMBULATORY_CARE_PROVIDER_SITE_OTHER): Payer: Medicare Other | Admitting: Family Medicine

## 2022-08-31 VITALS — BP 132/76 | HR 87 | Ht 65.0 in | Wt 148.0 lb

## 2022-08-31 DIAGNOSIS — M5136 Other intervertebral disc degeneration, lumbar region: Secondary | ICD-10-CM

## 2022-08-31 NOTE — Patient Instructions (Signed)
Ok to ride a bike 8-10 min 3x a week Can try a catch wedge but be careful Continue gabapentin  See me in 4 months

## 2022-08-31 NOTE — Assessment & Plan Note (Signed)
Known degenerative disc noted.  Patient still has some very mild weakness of the lower extremity.  Still some limited range of motion of the hips bilaterally.  Patient has responded very well to conservative therapy.  Discussed icing regimen and home exercises.  Increase activity slowly.  Patient will continue with the gabapentin 200 mg at night only does have room to go up if necessary.  Patient can see me sooner otherwise we will check in again in 4 to 6 months

## 2022-09-07 DIAGNOSIS — H524 Presbyopia: Secondary | ICD-10-CM | POA: Diagnosis not present

## 2022-09-07 DIAGNOSIS — H25813 Combined forms of age-related cataract, bilateral: Secondary | ICD-10-CM | POA: Diagnosis not present

## 2022-09-07 DIAGNOSIS — H04123 Dry eye syndrome of bilateral lacrimal glands: Secondary | ICD-10-CM | POA: Diagnosis not present

## 2022-10-26 ENCOUNTER — Other Ambulatory Visit: Payer: Self-pay | Admitting: Family Medicine

## 2022-10-26 DIAGNOSIS — Z1231 Encounter for screening mammogram for malignant neoplasm of breast: Secondary | ICD-10-CM

## 2022-11-30 DIAGNOSIS — Z170 Estrogen receptor positive status [ER+]: Secondary | ICD-10-CM | POA: Diagnosis not present

## 2022-11-30 DIAGNOSIS — C50412 Malignant neoplasm of upper-outer quadrant of left female breast: Secondary | ICD-10-CM | POA: Diagnosis not present

## 2022-12-21 NOTE — Progress Notes (Signed)
[name] [address] [place] [address] Phone: [phone] Subjective:   Bruce Donath, am serving as a scribe for Dr. Antoine Primas.  I'm seeing this patient by the request  of:  Nelwyn Salisbury, MD  CC: back pain follow up   [id number]  08/31/2022 Known degenerative disc noted.  Patient still has some very mild weakness of the lower extremity.  Still some limited range of motion of the hips bilaterally.  Patient has responded very well to conservative therapy.  Discussed icing regimen and home exercises.  Increase activity slowly.  Patient will continue with the gabapentin 200 mg at night only does have room to go up if necessary.  Patient can see me sooner otherwise we will check in again in 4 to 6 months     Update 12/22/2022 Stacy Gillespie is a 72 y.o. female coming in with complaint of lumbar DDD. Patient states that she does continue to have back pain. Working outside more this year pulling weeds and gardening. Wants to know if she should add turmeric to her supplements. Currently taking tart cherry. Wants to discuss again what is going on with her back based on the xray from last year.   Developed medial knee pain when riding the bike. Painful only on her bike. Riding the bike for exercise to manage back pain and wants to make sure her pain doesn't develop into something more in the knee. Does exercise classes.        Past Medical History:  Diagnosis Date   Allergy    Arthritis    Cancer (HCC) 05/2021   Breast   Cataract    COPD (chronic obstructive pulmonary disease) (HCC)    Hyperlipidemia    Osteopenia 07/2018   T score -1.3 FRAX 9% / 1.1%   Osteoporosis    Thyroid disease    Past Surgical History:  Procedure Laterality Date   BREAST BIOPSY Left 05/2021   BREAST LUMPECTOMY Left 08/16/2021   with lymph node dissection   BROKEN LEG     COLONOSCOPY  02/26/2012   per Dr. Russella Dar, clear, repeat in 10 yrs    NOSE SURGERY      Social History   Socioeconomic History   Marital status: Married    Spouse name: Not on file   Number of children: Not on file   Years of education: Not on file   Highest education level: Not on file  Occupational History   Not on file  Tobacco Use   Smoking status: Former    Packs/day: .5    Types: Cigarettes    Quit date: 07/07/2014    Years since quitting: 8.4   Smokeless tobacco: Never  Vaping Use   Vaping Use: Never used  Substance and Sexual Activity   Alcohol use: Yes    Alcohol/week: 0.0 standard drinks of alcohol    Comment: rare   Drug use: No   Sexual activity: Not Currently    Birth control/protection: Post-menopausal    Comment: 1st intercourse 72 yo-Fewer than 5 partners  Other Topics Concern   Not on file  Social History Narrative   Not on file   Social Determinants of Health   Financial Resource Strain: Not on file  Food Insecurity: No Food Insecurity (03/14/2022)   Hunger Vital Sign    Worried About Running Out of Food in the Last Year: Never true    Ran Out of Food in the Last Year: Never  true  Transportation Needs: No Transportation Needs (03/14/2022)   PRAPARE - Administrator, Civil Service (Medical): No    Lack of Transportation (Non-Medical): No  Physical Activity: Not on file  Stress: Not on file  Social Connections: Not on file   Allergies  Allergen Reactions   Chlorhexidine Gluconate     Swelling of tongue ( mouth rinse )   Clindamycin/Lincomycin Diarrhea   Red Dye Diarrhea and Nausea And Vomiting   Family History  Problem Relation Age of Onset   Arthritis Mother    Hypertension Mother    Arthritis Brother    Heart disease Brother    Stomach cancer Maternal Grandmother 100   Colon cancer Maternal Grandfather 49   Heart disease Paternal Grandmother    Colon cancer Paternal Grandfather 66   Breast cancer Neg Hx    Colon polyps Neg Hx    Esophageal cancer Neg Hx    Rectal cancer Neg Hx     Current Outpatient  Medications (Endocrine & Metabolic):    levothyroxine (SYNTHROID) 100 MCG tablet, Take 1 tablet (100 mcg total) by mouth daily.  Current Outpatient Medications (Cardiovascular):    rosuvastatin (CRESTOR) 10 MG tablet, Take 1 tablet by mouth  daily  Current Outpatient Medications (Respiratory):    triamcinolone (NASACORT AQ) 55 MCG/ACT AERO nasal inhaler, Place 2 sprays into the nose daily.  Current Outpatient Medications (Analgesics):    acetaminophen (TYLENOL) 325 MG tablet, Take by mouth.   Current Outpatient Medications (Other):    Calcium Carbonate-Vit D-Min (CALTRATE 600+D PLUS PO), Take by mouth.   Calcium Citrate-Vitamin D (CITRACAL + D PO), Take 1 tablet by mouth daily.   gabapentin (NEURONTIN) 100 MG capsule, Take 2 capsules (200 mg total) by mouth 2 (two) times daily.   tamoxifen (NOLVADEX) 10 MG tablet, Take 20 mg by mouth daily.   triamcinolone cream (KENALOG) 0.1 %, Apply 1 application topically 2 (two) times daily.   VITAMIN D PO, Take by mouth. Taking with calcium and vitamin D combination tab.   Reviewed prior external information including notes and imaging from  primary care provider As well as notes that were available from care everywhere and other healthcare systems.  Past medical history, social, surgical and family history all reviewed in electronic medical record.  No pertanent information unless stated regarding to the chief complaint.   Review of Systems:  No headache, visual changes, nausea, vomiting, diarrhea, constipation, dizziness, abdominal pain, skin rash, fevers, chills, night sweats, weight loss, swollen lymph nodes, body aches, joint swelling, chest pain, shortness of breath, mood changes. POSITIVE muscle aches  Objective  Blood pressure 122/80, pulse 76, height 5\' 5"  (1.651 m), weight 149 lb (67.6 kg), SpO2 96 %.   General: No apparent distress alert and oriented x3 mood and affect normal, dressed appropriately.  HEENT: Pupils equal, extraocular  movements intact  Respiratory: Patient's speak in full sentences and does not appear short of breath  Cardiovascular: No lower extremity edema, non tender, no erythema  Patient does have some loss of lordosis.  Mild radicular symptoms noted with straight leg test of the 25 degrees of flexion on the right side.  No significant increase in discomfort with any type of extension in the back in the L4-L5 and L5-S1 area.    Impression and Recommendations:

## 2022-12-22 ENCOUNTER — Ambulatory Visit (INDEPENDENT_AMBULATORY_CARE_PROVIDER_SITE_OTHER): Payer: Medicare Other | Admitting: Family Medicine

## 2022-12-22 ENCOUNTER — Encounter: Payer: Self-pay | Admitting: Family Medicine

## 2022-12-22 VITALS — BP 122/80 | HR 76 | Ht 65.0 in | Wt 149.0 lb

## 2022-12-22 DIAGNOSIS — M545 Low back pain, unspecified: Secondary | ICD-10-CM

## 2022-12-22 DIAGNOSIS — M5136 Other intervertebral disc degeneration, lumbar region: Secondary | ICD-10-CM

## 2022-12-22 NOTE — Assessment & Plan Note (Addendum)
Low back does have some loss of lordosis patient is having worsening pain with extension of the back.  Does have a history of malignant breast cancer and discussed with patient about this as well as the worsening pain I do feel advanced imaging is warranted.  The patient has now failed 4 months of formal physical therapy, home exercises as well as gabapentin.  We will see how the images find anything.  And discuss treatment options thereafter

## 2022-12-22 NOTE — Patient Instructions (Signed)
MRI lumbar [id number] Turmeric 500mg  in the morning I will write to you when we have the results

## 2023-01-28 ENCOUNTER — Other Ambulatory Visit: Payer: Self-pay | Admitting: Family Medicine

## 2023-02-05 ENCOUNTER — Ambulatory Visit
Admission: RE | Admit: 2023-02-05 | Discharge: 2023-02-05 | Disposition: A | Discharge status: Home / Self Care | Payer: Medicare Other | Source: Ambulatory Visit | Attending: Family Medicine | Admitting: Family Medicine

## 2023-02-05 DIAGNOSIS — M5116 Intervertebral disc disorders with radiculopathy, lumbar region: Secondary | ICD-10-CM | POA: Diagnosis not present

## 2023-02-05 DIAGNOSIS — M4726 Other spondylosis with radiculopathy, lumbar region: Secondary | ICD-10-CM | POA: Diagnosis not present

## 2023-02-05 DIAGNOSIS — M545 Low back pain, unspecified: Secondary | ICD-10-CM

## 2023-02-23 ENCOUNTER — Encounter: Payer: Self-pay | Admitting: Family Medicine

## 2023-02-23 ENCOUNTER — Ambulatory Visit (INDEPENDENT_AMBULATORY_CARE_PROVIDER_SITE_OTHER): Payer: Medicare Other

## 2023-02-23 ENCOUNTER — Ambulatory Visit (INDEPENDENT_AMBULATORY_CARE_PROVIDER_SITE_OTHER): Payer: Medicare Other | Admitting: Family Medicine

## 2023-02-23 VITALS — BP 106/52 | HR 69 | Temp 98.1°F | Ht 66.5 in | Wt 150.2 lb

## 2023-02-23 DIAGNOSIS — J449 Chronic obstructive pulmonary disease, unspecified: Secondary | ICD-10-CM | POA: Diagnosis not present

## 2023-02-23 DIAGNOSIS — C50412 Malignant neoplasm of upper-outer quadrant of left female breast: Secondary | ICD-10-CM

## 2023-02-23 DIAGNOSIS — L719 Rosacea, unspecified: Secondary | ICD-10-CM | POA: Diagnosis not present

## 2023-02-23 DIAGNOSIS — M159 Polyosteoarthritis, unspecified: Secondary | ICD-10-CM | POA: Diagnosis not present

## 2023-02-23 DIAGNOSIS — R739 Hyperglycemia, unspecified: Secondary | ICD-10-CM

## 2023-02-23 DIAGNOSIS — E559 Vitamin D deficiency, unspecified: Secondary | ICD-10-CM | POA: Diagnosis not present

## 2023-02-23 DIAGNOSIS — E039 Hypothyroidism, unspecified: Secondary | ICD-10-CM

## 2023-02-23 DIAGNOSIS — Z170 Estrogen receptor positive status [ER+]: Secondary | ICD-10-CM | POA: Diagnosis not present

## 2023-02-23 DIAGNOSIS — J4489 Other specified chronic obstructive pulmonary disease: Secondary | ICD-10-CM

## 2023-02-23 DIAGNOSIS — M150 Primary generalized (osteo)arthritis: Secondary | ICD-10-CM

## 2023-02-23 DIAGNOSIS — M51369 Other intervertebral disc degeneration, lumbar region without mention of lumbar back pain or lower extremity pain: Secondary | ICD-10-CM

## 2023-02-23 DIAGNOSIS — M1000 Idiopathic gout, unspecified site: Secondary | ICD-10-CM

## 2023-02-23 DIAGNOSIS — M5136 Other intervertebral disc degeneration, lumbar region: Secondary | ICD-10-CM

## 2023-02-23 DIAGNOSIS — H8110 Benign paroxysmal vertigo, unspecified ear: Secondary | ICD-10-CM

## 2023-02-23 DIAGNOSIS — E782 Mixed hyperlipidemia: Secondary | ICD-10-CM | POA: Diagnosis not present

## 2023-02-23 DIAGNOSIS — R918 Other nonspecific abnormal finding of lung field: Secondary | ICD-10-CM | POA: Diagnosis not present

## 2023-02-23 LAB — CBC WITH DIFFERENTIAL/PLATELET
Basophils Absolute: 0 10*3/uL (ref 0.0–0.1)
Basophils Relative: 0.6 % (ref 0.0–3.0)
Eosinophils Absolute: 0.1 10*3/uL (ref 0.0–0.7)
Eosinophils Relative: 1.2 % (ref 0.0–5.0)
HCT: 37.7 % (ref 36.0–46.0)
Hemoglobin: 12.6 g/dL (ref 12.0–15.0)
Lymphocytes Relative: 17.8 % (ref 12.0–46.0)
Lymphs Abs: 1.4 10*3/uL (ref 0.7–4.0)
MCHC: 33.4 g/dL (ref 30.0–36.0)
MCV: 91.1 fl (ref 78.0–100.0)
Monocytes Absolute: 0.6 10*3/uL (ref 0.1–1.0)
Monocytes Relative: 8.1 % (ref 3.0–12.0)
Neutro Abs: 5.5 10*3/uL (ref 1.4–7.7)
Neutrophils Relative %: 72.3 % (ref 43.0–77.0)
Platelets: 331 10*3/uL (ref 150.0–400.0)
RBC: 4.14 Mil/uL (ref 3.87–5.11)
RDW: 13.2 % (ref 11.5–15.5)
WBC: 7.6 10*3/uL (ref 4.0–10.5)

## 2023-02-23 LAB — BASIC METABOLIC PANEL
BUN: 16 mg/dL (ref 6–23)
CO2: 29 mEq/L (ref 19–32)
Calcium: 9.5 mg/dL (ref 8.4–10.5)
Chloride: 101 mEq/L (ref 96–112)
Creatinine, Ser: 0.9 mg/dL (ref 0.40–1.20)
GFR: 63.15 mL/min (ref >60.00)
Glucose, Bld: 87 mg/dL (ref 70–99)
Potassium: 4.3 mEq/L (ref 3.5–5.1)
Sodium: 137 mEq/L (ref 135–145)

## 2023-02-23 LAB — LIPID PANEL
Cholesterol: 171 mg/dL (ref 0–200)
HDL: 70.4 mg/dL (ref >39.00)
LDL Cholesterol: 80 mg/dL (ref 0–99)
NonHDL: 100.19
Total CHOL/HDL Ratio: 2
Triglycerides: 100 mg/dL (ref 0.0–149.0)
VLDL: 20 mg/dL (ref 0.0–40.0)

## 2023-02-23 LAB — HEPATIC FUNCTION PANEL
ALT: 11 U/L (ref 0–35)
AST: 18 U/L (ref 0–37)
Albumin: 4.1 g/dL (ref 3.5–5.2)
Alkaline Phosphatase: 69 U/L (ref 39–117)
Bilirubin, Direct: 0.1 mg/dL (ref 0.0–0.3)
Total Bilirubin: 0.3 mg/dL (ref 0.2–1.2)
Total Protein: 6.9 g/dL (ref 6.0–8.3)

## 2023-02-23 LAB — T3, FREE: T3, Free: 3.1 pg/mL (ref 2.3–4.2)

## 2023-02-23 LAB — VITAMIN D 25 HYDROXY (VIT D DEFICIENCY, FRACTURES): VITD: 47.31 ng/mL (ref 30.00–100.00)

## 2023-02-23 LAB — HEMOGLOBIN A1C: Hgb A1c MFr Bld: 5.5 % (ref 4.6–6.5)

## 2023-02-23 LAB — TSH: TSH: 0.2 u[IU]/mL — ABNORMAL LOW (ref 0.35–5.50)

## 2023-02-23 LAB — T4, FREE: Free T4: 1.14 ng/dL (ref 0.60–1.60)

## 2023-02-23 MED ORDER — TRIAMCINOLONE ACETONIDE 0.1 % EX CREA: 1.0000 | TOPICAL_CREAM | Freq: Two times a day (BID) | CUTANEOUS | 5 refills | Status: AC

## 2023-02-23 MED ORDER — LEVOTHYROXINE SODIUM 100 MCG PO TABS: 100.0000 ug | ORAL_TABLET | Freq: Every day | ORAL | 3 refills | Status: DC

## 2023-02-23 MED ORDER — METRONIDAZOLE 0.75 % EX GEL: 1.0000 | Freq: Two times a day (BID) | CUTANEOUS | 5 refills | Status: DC

## 2023-02-23 MED ORDER — ROSUVASTATIN CALCIUM 10 MG PO TABS: ORAL_TABLET | ORAL | 3 refills | Status: DC

## 2023-02-23 NOTE — Progress Notes (Signed)
Subjective:    Patient ID: Stacy Gillespie, female    DOB: 1951-04-05, 72 y.o.   MRN: [id number]  HPI Here to follow up on issues. She has a few questions. Her arthritis has been bothering her, and Gabapentin and Tylenol are not helping much. She also has an irritating rash on her forehead and cheeks. This started a few months ago. Triamcinolone cream helps a little. She continues to see Oncology and Radiation Oncology for the hx of breast cancer.  She has not been bothered by vertigo in quite a long time. Her COPD is stable but it has been 2 years since we got a CXR.   Review of Systems  Constitutional: Negative.   HENT: Negative.    Eyes: Negative.   Respiratory: Negative.    Cardiovascular: Negative.   Gastrointestinal: Negative.   Genitourinary:  Negative for decreased urine volume, difficulty urinating, dyspareunia, dysuria, enuresis, flank pain, frequency, hematuria, pelvic pain and urgency.  Musculoskeletal:  Positive for arthralgias and back pain.  Skin:  Positive for rash.  Neurological: Negative.  Negative for headaches.  Psychiatric/Behavioral: Negative.         Objective:   Physical Exam Constitutional:      General: She is not in acute distress.    Appearance: Normal appearance. She is well-developed.  HENT:     Head: Normocephalic and atraumatic.     Right Ear: External ear normal.     Left Ear: External ear normal.     Nose: Nose normal.     Mouth/Throat:     Pharynx: No oropharyngeal exudate.  Eyes:     General: No scleral icterus.    Conjunctiva/sclera: Conjunctivae normal.     Pupils: Pupils are equal, round, and reactive to light.  Neck:     Thyroid: No thyromegaly.     Vascular: No JVD.  Cardiovascular:     Rate and Rhythm: Normal rate and regular rhythm.     Pulses: Normal pulses.     Heart sounds: Normal heart sounds. No murmur heard.    No friction rub. No gallop.  Pulmonary:     Effort: Pulmonary effort is normal. No respiratory distress.      Breath sounds: Normal breath sounds. No wheezing or rales.  Chest:     Chest wall: No tenderness.  Abdominal:     General: Bowel sounds are normal. There is no distension.     Palpations: Abdomen is soft. There is no mass.     Tenderness: There is no abdominal tenderness. There is no guarding or rebound.  Musculoskeletal:        General: No tenderness. Normal range of motion.     Cervical back: Normal range of motion and neck supple.  Lymphadenopathy:     Cervical: No cervical adenopathy.  Skin:    General: Skin is warm and dry.     Comments: She has macular erythema on the forehead and both cheeks   Neurological:     General: No focal deficit present.     Mental Status: She is alert and oriented to person, place, and time.     Cranial Nerves: No cranial nerve deficit.     Motor: No abnormal muscle tone.     Coordination: Coordination normal.     Deep Tendon Reflexes: Reflexes are normal and symmetric. Reflexes normal.  Psychiatric:        Mood and Affect: Mood normal.        Behavior: Behavior normal.  Thought Content: Thought content normal.        Judgment: Judgment normal.           Assessment & Plan:  She is doing well after treatment for breast cancer. For the OA pains, she will start back on Diclofenac 75 mg BID. She has rosacea on the face, so we will treat this with Metronidazole gel. Her COPD is stable but we will get another CXR. We will get fasting labs to check lipids, a thyroid panel, etc. We spent a total of (34   ) minutes reviewing records and discussing these issues.  Gershon Crane, MD

## 2023-02-27 ENCOUNTER — Telehealth: Payer: Self-pay | Admitting: Family Medicine

## 2023-02-27 NOTE — Telephone Encounter (Signed)
Pt states her pharmacy has not received her prescription for triamcinolone cream. She has checked with them and they state they do not have it. She wanted to know if the nurse could reach out to pharmacy to check on this.

## 2023-02-27 NOTE — Telephone Encounter (Signed)
Spoke with pt pharmacy states that pt Rx has been ready for pick up. Pt notified

## 2023-04-16 ENCOUNTER — Telehealth: Payer: Self-pay | Admitting: Family Medicine

## 2023-04-16 ENCOUNTER — Ambulatory Visit (INDEPENDENT_AMBULATORY_CARE_PROVIDER_SITE_OTHER): Payer: Medicare Other

## 2023-04-16 DIAGNOSIS — Z23 Encounter for immunization: Secondary | ICD-10-CM | POA: Diagnosis not present

## 2023-04-16 NOTE — Telephone Encounter (Signed)
Tom [facility] with [facility] is calling and would like ok to switch from amneal to lupin for levothyroxine (SYNTHROID) 100 MCG tablet  reference number is [id number]

## 2023-04-16 NOTE — Telephone Encounter (Signed)
This change is okay with me

## 2023-04-17 NOTE — Telephone Encounter (Signed)
Spoke with pt pharmacy advised of approval of the change of Levothyroxine manufacturing company

## 2023-04-27 ENCOUNTER — Ambulatory Visit: Payer: Medicare Other

## 2023-05-23 NOTE — Progress Notes (Unsigned)
[facility] [address] [place] [address] Phone: [phone] Subjective:   Stacy Gillespie, am serving as a scribe for Dr. Antoine Primas.  I'm seeing this patient by the request  of:  Nelwyn Salisbury, MD  CC: low back pain follow up new knee pain   [id number]  12/22/2022 Low back does have some loss of lordosis patient is having worsening pain with extension of the back.  Does have a history of malignant breast cancer and discussed with patient about this as well as the worsening pain I do feel advanced imaging is warranted.  The patient has now failed 4 months of formal physical therapy, home exercises as well as gabapentin.  We will see how the images find anything.  And discuss treatment options thereafter      Update 05/24/2023 Stacy Gillespie is a 72 y.o. female coming in with complaint of lumbar spine pain. Patient states having a bit of lateral right knee pain. Walking around and the next day felt like her knee was going to give way. Has done it a couple of times. Refill gabapentin. Back only bothers her when cleaning. L shoulder is getting better.     Past Medical History:  Diagnosis Date   Allergy    Arthritis    Cancer (HCC) 05/2021   Breast   Cataract    COPD (chronic obstructive pulmonary disease) (HCC)    Hyperlipidemia    Osteopenia 07/2018   T score -1.3 FRAX 9% / 1.1%   Osteoporosis    Thyroid disease    Past Surgical History:  Procedure Laterality Date   BREAST BIOPSY Left 05/2021   BREAST LUMPECTOMY Left 08/16/2021   with lymph node dissection   BROKEN LEG     COLONOSCOPY  05/18/2022   per Dr. Russella Dar, adenomatous polyps, repeat in 7 yrs   NOSE SURGERY     Social History   Socioeconomic History   Marital status: Married    Spouse name: Not on file   Number of children: Not on file   Years of education: Not on file   Highest education level: Not on file  Occupational History   Not on file  Tobacco Use    Smoking status: Former    Current packs/day: 0.00    Types: Cigarettes    Quit date: 07/07/2014    Years since quitting: 8.8   Smokeless tobacco: Never  Vaping Use   Vaping status: Never Used  Substance and Sexual Activity   Alcohol use: Yes    Alcohol/week: 0.0 standard drinks of alcohol    Comment: rare   Drug use: No   Sexual activity: Not Currently    Birth control/protection: Post-menopausal    Comment: 1st intercourse 72 yo-Fewer than 5 partners  Other Topics Concern   Not on file  Social History Narrative   Not on file   Social Determinants of Health   Financial Resource Strain: Not on file  Food Insecurity: No Food Insecurity (03/14/2022)   Hunger Vital Sign    Worried About Running Out of Food in the Last Year: Never true    Ran Out of Food in the Last Year: Never true  Transportation Needs: No Transportation Needs (03/14/2022)   PRAPARE - Transportation    Lack of Transportation (Medical): No    Lack of Transportation (Non-Medical): No  Physical Activity: Insufficiently Active (02/23/2023)   Exercise Vital Sign    Days of Exercise per Week: 1 day  Minutes of Exercise per Session: 60 min  Stress: No Stress Concern Present (02/23/2023)   [employer] of Occupational Health - Occupational Stress Questionnaire    Feeling of Stress : Not at all  Social Connections: Moderately Integrated (02/23/2023)   Social Connection and Isolation Panel [NHANES]    Frequency of Communication with Friends and Family: More than three times a week    Frequency of Social Gatherings with Friends and Family: Twice a week    Attends Religious Services: Never    Database administrator or Organizations: Yes    Attends Engineer, structural: More than 4 times per year    Marital Status: Married   Allergies  Allergen Reactions   Chlorhexidine Gluconate     Swelling of tongue ( mouth rinse )   Clindamycin/Lincomycin Diarrhea   Red Dye #40 (Allura Red) Diarrhea and Nausea And  Vomiting   Family History  Problem Relation Age of Onset   Arthritis Mother    Hypertension Mother    Arthritis Brother    Heart disease Brother    Stomach cancer Maternal Grandmother 60   Colon cancer Maternal Grandfather 57   Heart disease Paternal Grandmother    Colon cancer Paternal Grandfather 50   Breast cancer Neg Hx    Colon polyps Neg Hx    Esophageal cancer Neg Hx    Rectal cancer Neg Hx     Current Outpatient Medications (Endocrine & Metabolic):    levothyroxine (SYNTHROID) 100 MCG tablet, Take 1 tablet (100 mcg total) by mouth daily.  Current Outpatient Medications (Cardiovascular):    rosuvastatin (CRESTOR) 10 MG tablet, Take 1 tablet by mouth  daily  Current Outpatient Medications (Respiratory):    triamcinolone (NASACORT AQ) 55 MCG/ACT AERO nasal inhaler, Place 2 sprays into the nose daily.  Current Outpatient Medications (Analgesics):    acetaminophen (TYLENOL) 325 MG tablet, Take by mouth.   Current Outpatient Medications (Other):    Calcium Carbonate-Vit D-Min (CALTRATE 600+D PLUS PO), Take by mouth.   Calcium Citrate-Vitamin D (CITRACAL + D PO), Take 1 tablet by mouth daily.   gabapentin (NEURONTIN) 100 MG capsule, TAKE 2 CAPSULES BY MOUTH TWICE  DAILY   metroNIDAZOLE (METROGEL) 0.75 % gel, Apply 1 Application topically 2 (two) times daily.   tamoxifen (NOLVADEX) 10 MG tablet, Take 20 mg by mouth daily.   triamcinolone cream (KENALOG) 0.1 %, Apply 1 Application topically 2 (two) times daily.   VITAMIN D PO, Take by mouth. Taking with calcium and vitamin D combination tab.   Reviewed prior external information including notes and imaging from  primary care provider As well as notes that were available from care everywhere and other healthcare systems.  Past medical history, social, surgical and family history all reviewed in electronic medical record.  No pertanent information unless stated regarding to the chief complaint.   Review of Systems:  No  headache, visual changes, nausea, vomiting, diarrhea, constipation, dizziness, abdominal pain, skin rash, fevers, chills, night sweats, weight loss, swollen lymph nodes, body aches, joint swelling, chest pain, shortness of breath, mood changes. POSITIVE muscle aches  Objective  Blood pressure 116/64, pulse 87, height 5\' 6"  (1.676 m), weight 149 lb (67.6 kg), SpO2 97%.   General: No apparent distress alert and oriented x3 mood and affect normal, dressed appropriately.  HEENT: Pupils equal, extraocular movements intact  Respiratory: Patient's speak in full sentences and does not appear short of breath  Cardiovascular: No lower extremity edema, non tender, no erythema  Patient's right knee does have some crepitus noted.  Lateral tracking of the patella noted.  No significant instability noted though.  [id number]; 15 additional minutes spent for Therapeutic exercises as stated in above notes.  This included exercises focusing on stretching, strengthening, with significant focus on eccentric aspects.   Long term goals include an improvement in range of motion, strength, endurance as well as avoiding reinjury. Patient's frequency would include in 1-2 times a day, 3-5 times a week for a duration of 6-12 weeks.  Reviewed anatomy using anatomical model and how PFS occurs.  Given rehab exercises handout for VMO, hip abductors, core, entire kinetic chain including proprioception exercises.  Could benefit from PT, regular exercise, upright biking, and a PFS knee brace to assist with tracking abnormalities. Proper technique shown and discussed handout in great detail with ATC.  All questions were discussed and answered.      Impression and Recommendations:     The above documentation has been reviewed and is accurate and complete Judi Saa, DO

## 2023-05-24 ENCOUNTER — Encounter: Payer: Self-pay | Admitting: Family Medicine

## 2023-05-24 ENCOUNTER — Ambulatory Visit (INDEPENDENT_AMBULATORY_CARE_PROVIDER_SITE_OTHER): Payer: Medicare Other | Admitting: Family Medicine

## 2023-05-24 ENCOUNTER — Ambulatory Visit (INDEPENDENT_AMBULATORY_CARE_PROVIDER_SITE_OTHER): Payer: Medicare Other

## 2023-05-24 VITALS — BP 116/64 | HR 87 | Ht 66.0 in | Wt 149.0 lb

## 2023-05-24 DIAGNOSIS — M25561 Pain in right knee: Secondary | ICD-10-CM | POA: Diagnosis not present

## 2023-05-24 DIAGNOSIS — M222X1 Patellofemoral disorders, right knee: Secondary | ICD-10-CM | POA: Diagnosis not present

## 2023-05-24 DIAGNOSIS — M1711 Unilateral primary osteoarthritis, right knee: Secondary | ICD-10-CM | POA: Diagnosis not present

## 2023-05-24 NOTE — Patient Instructions (Signed)
Do prescribed exercises at least 3x a week Xray today See you again in 7-8 weeks Wear brace daily for 1 week and with exercise for 6 weeks

## 2023-05-24 NOTE — Assessment & Plan Note (Signed)
Patient does have patellofemoral arthritis.  Brace given today.  Discussed continuing to stay active.  VMO strengthening and hip exercises given from athletic trainer.  Follow-up again in 6 to 8 weeks

## 2023-05-30 ENCOUNTER — Other Ambulatory Visit: Payer: Self-pay | Admitting: Family Medicine

## 2023-05-30 DIAGNOSIS — N6489 Other specified disorders of breast: Secondary | ICD-10-CM

## 2023-05-30 DIAGNOSIS — Z853 Personal history of malignant neoplasm of breast: Secondary | ICD-10-CM

## 2023-06-04 ENCOUNTER — Ambulatory Visit: Payer: Medicare Other

## 2023-06-07 ENCOUNTER — Ambulatory Visit
Admission: RE | Admit: 2023-06-07 | Discharge: 2023-06-07 | Disposition: A | Discharge status: Home / Self Care | Payer: Medicare Other | Source: Ambulatory Visit | Attending: Family Medicine | Admitting: Family Medicine

## 2023-06-07 DIAGNOSIS — Z853 Personal history of malignant neoplasm of breast: Secondary | ICD-10-CM

## 2023-06-07 DIAGNOSIS — N6489 Other specified disorders of breast: Secondary | ICD-10-CM

## 2023-06-07 HISTORY — DX: Personal history of irradiation: Z92.3

## 2023-06-20 IMAGING — DX DG CHEST 2V
2 series · 2 of 2 positions shown · non-contrast
Comparison: 01/21/2019

CLINICAL DATA: Follow-up COPD

EXAM:
CHEST - 2 VIEW

[chest pa]
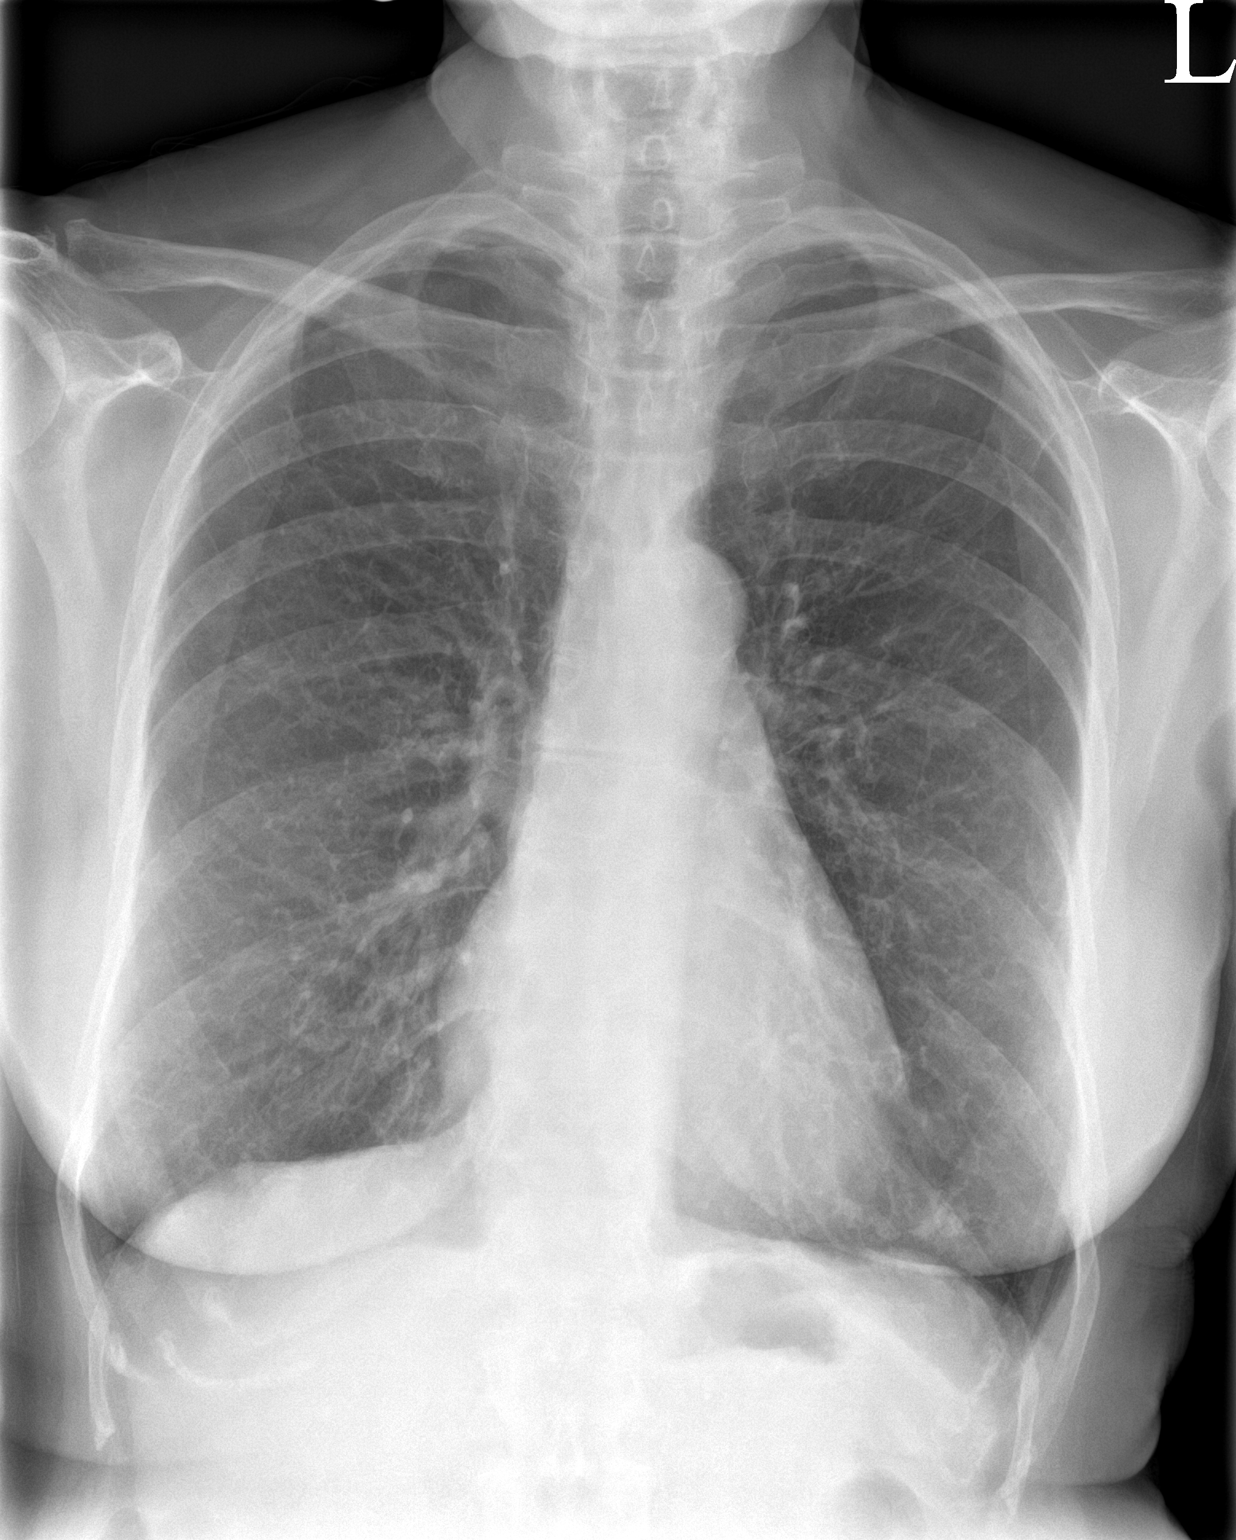

[chest lat]
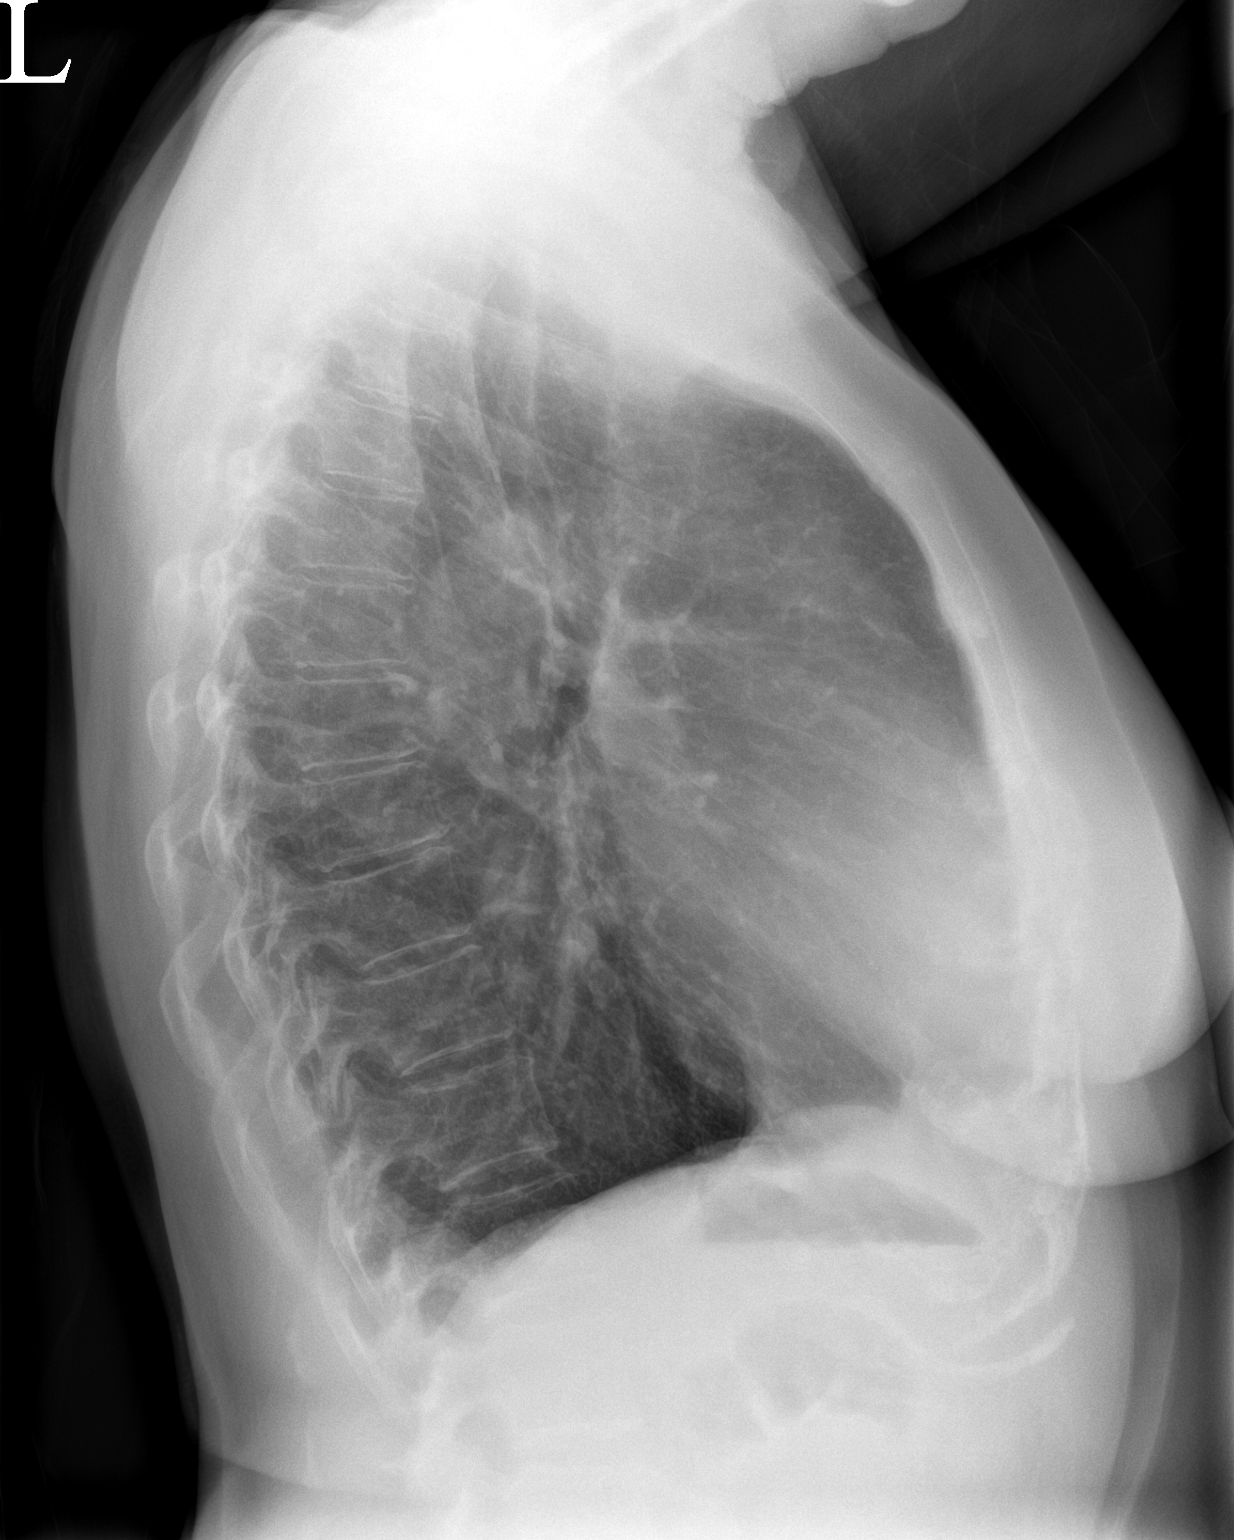

[2 of 2 positions shown; findings below may reference images not displayed]

FINDINGS: COPD with pulmonary hyperinflation. Lungs remain clear without
infiltrate effusion or mass. Heart size and vascularity normal.
Negative for heart failure.
IMPRESSION: COPD without acute cardiopulmonary abnormality.  No interval change.

## 2023-06-26 DIAGNOSIS — C50412 Malignant neoplasm of upper-outer quadrant of left female breast: Secondary | ICD-10-CM | POA: Diagnosis not present

## 2023-06-26 DIAGNOSIS — Z170 Estrogen receptor positive status [ER+]: Secondary | ICD-10-CM | POA: Diagnosis not present

## 2023-07-06 ENCOUNTER — Ambulatory Visit: Payer: Medicare Other | Admitting: Family Medicine

## 2023-07-20 NOTE — Progress Notes (Unsigned)
[name] [address] [place] [address] Phone: [phone] Subjective:   Stacy Gillespie, am serving as a scribe for Dr. Antoine Primas.  I'm seeing this patient by the request  of:  Nelwyn Salisbury, MD  CC: Right knee pain follow-up  [id number]  05/24/2023 Patient does have patellofemoral arthritis.  Brace given today.  Discussed continuing to stay active.  VMO strengthening and hip exercises given from athletic trainer.  Follow-up again in 6 to 8 weeks      Update 07/23/2023 Stacy Gillespie is a 72 y.o. female coming in with complaint of R knee pain.  Known patellofemoral arthritis.  Patient states has pulled something between shoulder before thanksgiving. Started getting better. Got worse again. Hurts standing for a while and taking a deep breath. Only wore brace for about 2 days because knee was hurting worse. Has been getting better with exercise.    Recently had been seen by oncology where patient is now greater than 2 years out from lumpectomy and can go back to annual screening from mammogram.  Patient's cancer is HER2 negative.  Did have radiation therapy and is continuing on tamoxifen  Past Medical History:  Diagnosis Date   Allergy    Arthritis    Cancer (HCC) 05/2021   Breast   Cataract    COPD (chronic obstructive pulmonary disease) (HCC)    Hyperlipidemia    Osteopenia 07/2018   T score -1.3 FRAX 9% / 1.1%   Osteoporosis    Personal history of radiation therapy    Thyroid disease    Past Surgical History:  Procedure Laterality Date   BREAST BIOPSY Left 05/2021   BREAST LUMPECTOMY Left 08/16/2021   with lymph node dissection   BROKEN LEG     COLONOSCOPY  05/18/2022   per Dr. Russella Dar, adenomatous polyps, repeat in 7 yrs   NOSE SURGERY     Social History   Socioeconomic History   Marital status: Married    Spouse name: Not on file   Number of children: Not on file   Years of education: Not on file   Highest  education level: Not on file  Occupational History   Not on file  Tobacco Use   Smoking status: Former    Current packs/day: 0.00    Types: Cigarettes    Quit date: 07/07/2014    Years since quitting: 9.0   Smokeless tobacco: Never  Vaping Use   Vaping status: Never Used  Substance and Sexual Activity   Alcohol use: Yes    Alcohol/week: 0.0 standard drinks of alcohol    Comment: rare   Drug use: No   Sexual activity: Not Currently    Birth control/protection: Post-menopausal    Comment: 1st intercourse 72 yo-Fewer than 5 partners  Other Topics Concern   Not on file  Social History Narrative   Not on file   Social Drivers of Health   Financial Resource Strain: Not on file  Food Insecurity: No Food Insecurity (03/14/2022)   Hunger Vital Sign    Worried About Running Out of Food in the Last Year: Never true    Ran Out of Food in the Last Year: Never true  Transportation Needs: No Transportation Needs (03/14/2022)   PRAPARE - Transportation    Lack of Transportation (Medical): No    Lack of Transportation (Non-Medical): No  Physical Activity: Insufficiently Active (02/23/2023)   Exercise Vital Sign    Days of Exercise per Week:  1 day    Minutes of Exercise per Session: 60 min  Stress: No Stress Concern Present (02/23/2023)   [employer] of Occupational Health - Occupational Stress Questionnaire    Feeling of Stress : Not at all  Social Connections: Moderately Integrated (02/23/2023)   Social Connection and Isolation Panel [NHANES]    Frequency of Communication with Friends and Family: More than three times a week    Frequency of Social Gatherings with Friends and Family: Twice a week    Attends Religious Services: Never    Database administrator or Organizations: Yes    Attends Engineer, structural: More than 4 times per year    Marital Status: Married   Allergies  Allergen Reactions   Chlorhexidine Gluconate     Swelling of tongue ( mouth rinse )    Clindamycin/Lincomycin Diarrhea   Red Dye #40 (Allura Red) Diarrhea and Nausea And Vomiting   Family History  Problem Relation Age of Onset   Arthritis Mother    Hypertension Mother    Arthritis Brother    Heart disease Brother    Stomach cancer Maternal Grandmother 60   Colon cancer Maternal Grandfather 22   Heart disease Paternal Grandmother    Colon cancer Paternal Grandfather 19   Breast cancer Neg Hx    Colon polyps Neg Hx    Esophageal cancer Neg Hx    Rectal cancer Neg Hx     Current Outpatient Medications (Endocrine & Metabolic):    calcitonin, salmon, (MIACALCIN/FORTICAL) 200 UNIT/ACT nasal spray, Place 2 sprays into alternate nostrils daily.   levothyroxine (SYNTHROID) 100 MCG tablet, Take 1 tablet (100 mcg total) by mouth daily.  Current Outpatient Medications (Cardiovascular):    rosuvastatin (CRESTOR) 10 MG tablet, Take 1 tablet by mouth  daily  Current Outpatient Medications (Respiratory):    triamcinolone (NASACORT AQ) 55 MCG/ACT AERO nasal inhaler, Place 2 sprays into the nose daily.  Current Outpatient Medications (Analgesics):    traMADol (ULTRAM) 50 MG tablet, Take 1 tablet (50 mg total) by mouth every 8 (eight) hours as needed for up to 5 days.   acetaminophen (TYLENOL) 325 MG tablet, Take by mouth.   Current Outpatient Medications (Other):    Calcium Carbonate-Vit D-Min (CALTRATE 600+D PLUS PO), Take by mouth.   Calcium Citrate-Vitamin D (CITRACAL + D PO), Take 1 tablet by mouth daily.   gabapentin (NEURONTIN) 100 MG capsule, TAKE 2 CAPSULES BY MOUTH TWICE  DAILY   metroNIDAZOLE (METROGEL) 0.75 % gel, Apply 1 Application topically 2 (two) times daily.   tamoxifen (NOLVADEX) 10 MG tablet, Take 20 mg by mouth daily.   triamcinolone cream (KENALOG) 0.1 %, Apply 1 Application topically 2 (two) times daily.   VITAMIN D PO, Take by mouth. Taking with calcium and vitamin D combination tab.   Reviewed prior external information including notes and imaging from   primary care provider As well as notes that were available from care everywhere and other healthcare systems.  Past medical history, social, surgical and family history all reviewed in electronic medical record.  No pertanent information unless stated regarding to the chief complaint.   Review of Systems:  No headache, visual changes, nausea, vomiting, diarrhea, constipation, dizziness, abdominal pain, skin rash, fevers, chills, night sweats, weight loss, swollen lymph nodes, body aches, joint swelling, chest pain, shortness of breath, mood changes. POSITIVE muscle aches  Objective  Blood pressure 128/86, pulse 88, height 5\' 6"  (1.676 m), weight 150 lb (68 kg), SpO2 96%.  General: No apparent distress alert and oriented x3 mood and affect normal, dressed appropriately.  HEENT: Pupils equal, extraocular movements intact  Respiratory: Patient's speak in full sentences and does not appear short of breath  Cardiovascular: No lower extremity edema, non tender, no erythema  Right knee exam shows mild habitus noted knee.  Severe low midline tenderness noted in the thoracic spine at the T5-T6 and T7 levels.  Patient has difficulty catching her breath after palpation in the area.  Patient does not though appear short of breath and is able to speak in full sentences.  Past medical history is significant for COPD    Impression and Recommendations:     The above documentation has been reviewed and is accurate and complete Judi Saa, DO

## 2023-07-23 ENCOUNTER — Encounter: Payer: Self-pay | Admitting: Family Medicine

## 2023-07-23 ENCOUNTER — Ambulatory Visit (INDEPENDENT_AMBULATORY_CARE_PROVIDER_SITE_OTHER): Payer: Medicare Other

## 2023-07-23 ENCOUNTER — Ambulatory Visit (INDEPENDENT_AMBULATORY_CARE_PROVIDER_SITE_OTHER): Payer: Medicare Other | Admitting: Family Medicine

## 2023-07-23 VITALS — BP 128/86 | HR 88 | Ht 66.0 in | Wt 150.0 lb

## 2023-07-23 DIAGNOSIS — M47814 Spondylosis without myelopathy or radiculopathy, thoracic region: Secondary | ICD-10-CM | POA: Diagnosis not present

## 2023-07-23 DIAGNOSIS — M542 Cervicalgia: Secondary | ICD-10-CM | POA: Diagnosis not present

## 2023-07-23 DIAGNOSIS — M2550 Pain in unspecified joint: Secondary | ICD-10-CM

## 2023-07-23 DIAGNOSIS — M546 Pain in thoracic spine: Secondary | ICD-10-CM

## 2023-07-23 DIAGNOSIS — S22000A Wedge compression fracture of unspecified thoracic vertebra, initial encounter for closed fracture: Secondary | ICD-10-CM | POA: Diagnosis not present

## 2023-07-23 DIAGNOSIS — C50412 Malignant neoplasm of upper-outer quadrant of left female breast: Secondary | ICD-10-CM | POA: Diagnosis not present

## 2023-07-23 DIAGNOSIS — M438X4 Other specified deforming dorsopathies, thoracic region: Secondary | ICD-10-CM | POA: Diagnosis not present

## 2023-07-23 DIAGNOSIS — M47812 Spondylosis without myelopathy or radiculopathy, cervical region: Secondary | ICD-10-CM | POA: Diagnosis not present

## 2023-07-23 DIAGNOSIS — M549 Dorsalgia, unspecified: Secondary | ICD-10-CM | POA: Diagnosis not present

## 2023-07-23 DIAGNOSIS — Z853 Personal history of malignant neoplasm of breast: Secondary | ICD-10-CM

## 2023-07-23 DIAGNOSIS — Z170 Estrogen receptor positive status [ER+]: Secondary | ICD-10-CM

## 2023-07-23 DIAGNOSIS — M4854XA Collapsed vertebra, not elsewhere classified, thoracic region, initial encounter for fracture: Secondary | ICD-10-CM | POA: Diagnosis not present

## 2023-07-23 LAB — CBC WITH DIFFERENTIAL/PLATELET
Basophils Absolute: 0.4 10*3/uL — ABNORMAL HIGH (ref 0.0–0.1)
Basophils Relative: 5.1 % — ABNORMAL HIGH (ref 0.0–3.0)
Eosinophils Absolute: 0.1 10*3/uL (ref 0.0–0.7)
Eosinophils Relative: 1 % (ref 0.0–5.0)
HCT: 39.2 % (ref 36.0–46.0)
Hemoglobin: 13 g/dL (ref 12.0–15.0)
Lymphocytes Relative: 15.9 % (ref 12.0–46.0)
Lymphs Abs: 1.2 10*3/uL (ref 0.7–4.0)
MCHC: 33.2 g/dL (ref 30.0–36.0)
MCV: 92.6 fL (ref 78.0–100.0)
Monocytes Absolute: 0.5 10*3/uL (ref 0.1–1.0)
Monocytes Relative: 6.6 % (ref 3.0–12.0)
Neutro Abs: 5.4 10*3/uL (ref 1.4–7.7)
Neutrophils Relative %: 71.4 % (ref 43.0–77.0)
Platelets: 337 10*3/uL (ref 150.0–400.0)
RBC: 4.23 Mil/uL (ref 3.87–5.11)
RDW: 12.9 % (ref 11.5–15.5)
WBC: 7.6 10*3/uL (ref 4.0–10.5)

## 2023-07-23 LAB — COMPREHENSIVE METABOLIC PANEL
ALT: 10 U/L (ref 0–35)
AST: 18 U/L (ref 0–37)
Albumin: 4.4 g/dL (ref 3.5–5.2)
Alkaline Phosphatase: 72 U/L (ref 39–117)
BUN: 21 mg/dL (ref 6–23)
CO2: 27 meq/L (ref 19–32)
Calcium: 9.7 mg/dL (ref 8.4–10.5)
Chloride: 99 meq/L (ref 96–112)
Creatinine, Ser: 0.97 mg/dL (ref 0.40–1.20)
GFR: 57.55 mL/min — ABNORMAL LOW (ref >60.00)
Glucose, Bld: 95 mg/dL (ref 70–99)
Potassium: 4.8 meq/L (ref 3.5–5.1)
Sodium: 136 meq/L (ref 135–145)
Total Bilirubin: 0.4 mg/dL (ref 0.2–1.2)
Total Protein: 7.3 g/dL (ref 6.0–8.3)

## 2023-07-23 LAB — SEDIMENTATION RATE: Sed Rate: 16 mm/h (ref 0–30)

## 2023-07-23 MED ORDER — CALCITONIN (SALMON) 200 UNIT/ACT NA SOLN: 2.0000 | Freq: Every day | NASAL | 0 refills | Status: AC

## 2023-07-23 MED ORDER — TRAMADOL HCL 50 MG PO TABS: 50.0000 mg | ORAL_TABLET | Freq: Three times a day (TID) | ORAL | 0 refills | Status: AC | PRN

## 2023-07-23 NOTE — Assessment & Plan Note (Signed)
Patient pain is midline at the T6-7 level that corresponds with patient's x-rays abnormality.  With patient's history though of breast cancer status postradiation and on tamoxifen she is an increased risk of the occult fracture.  Would like to get MRI to make sure that there is no metastasis that could be potentially playing a role discussed icing regimen and home exercises.  Discussed avoiding certain activities, discussed calcitonin but will get also laboratory workup to make sure patient calcium levels are appropriate for treatment.  Patient's previous bone density showed a -1.9 osteopenia.  May of last year.  Would recommend another 1 in 2025.

## 2023-07-23 NOTE — Patient Instructions (Addendum)
[facility] [phone] Call Today  When we receive your results we will contact you.  Labs  See you again in 3-4 weeks

## 2023-07-24 ENCOUNTER — Inpatient Hospital Stay
Admission: RE | Admit: 2023-07-24 | Discharge: 2023-07-24 | Discharge status: Home / Self Care | Payer: Medicare Other | Source: Ambulatory Visit | Attending: Family Medicine | Admitting: Family Medicine

## 2023-07-24 DIAGNOSIS — M4854XA Collapsed vertebra, not elsewhere classified, thoracic region, initial encounter for fracture: Secondary | ICD-10-CM | POA: Diagnosis not present

## 2023-07-24 DIAGNOSIS — M5124 Other intervertebral disc displacement, thoracic region: Secondary | ICD-10-CM | POA: Diagnosis not present

## 2023-07-24 DIAGNOSIS — Z853 Personal history of malignant neoplasm of breast: Secondary | ICD-10-CM

## 2023-07-24 DIAGNOSIS — M546 Pain in thoracic spine: Secondary | ICD-10-CM

## 2023-07-24 DIAGNOSIS — M47814 Spondylosis without myelopathy or radiculopathy, thoracic region: Secondary | ICD-10-CM | POA: Diagnosis not present

## 2023-07-25 LAB — PTH, INTACT AND CALCIUM
Calcium: 9.6 mg/dL (ref 8.6–10.4)
PTH: 40 pg/mL (ref 16–77)

## 2023-07-25 LAB — D-DIMER, QUANTITATIVE: D-Dimer, Quant: 0.35 ug{FEU}/mL (ref <0.50)

## 2023-08-05 ENCOUNTER — Encounter: Payer: Self-pay | Admitting: Family Medicine

## 2023-08-17 NOTE — Progress Notes (Unsigned)
[name] [address] [place] [address] Phone: [phone] Subjective:   INadine Counts, am serving as a scribe for Dr. Antoine Primas.  I'm seeing this patient by the request  of:  Nelwyn Salisbury, MD  CC: thoracic back pain   ION:[id number]  07/23/2023 Patient pain is midline at the T6-7 level that corresponds with patient's x-rays abnormality.  With patient's history though of breast cancer status postradiation and on tamoxifen she is an increased risk of the occult fracture.  Would like to get MRI to make sure that there is no metastasis that could be potentially playing a role discussed icing regimen and home exercises.  Discussed avoiding certain activities, discussed calcitonin but will get also laboratory workup to make sure patient calcium levels are appropriate for treatment.  Patient's previous bone density showed a -1.9 osteopenia.  May of last year.  Would recommend another 1 in 2025.     Update 08/20/2023 Stacy Gillespie is a 73 y.o. female coming in with complaint of thoracic spine pain. Patient states wants to talk MRI and has question about knee.  MRI showed 12/31 acute fracture compression T7 otherwise only arthritis    Past Medical History:  Diagnosis Date   Allergy    Arthritis    Cancer (HCC) 05/2021   Breast   Cataract    COPD (chronic obstructive pulmonary disease) (HCC)    Hyperlipidemia    Osteopenia 07/2018   T score -1.3 FRAX 9% / 1.1%   Osteoporosis    Personal history of radiation therapy    Thyroid disease    Past Surgical History:  Procedure Laterality Date   BREAST BIOPSY Left 05/2021   BREAST LUMPECTOMY Left 08/16/2021   with lymph node dissection   BROKEN LEG     COLONOSCOPY  05/18/2022   per Dr. Russella Dar, adenomatous polyps, repeat in 7 yrs   NOSE SURGERY     Social History   Socioeconomic History   Marital status: Married    Spouse name: Not on file   Number of children: Not on file   Years  of education: Not on file   Highest education level: Not on file  Occupational History   Not on file  Tobacco Use   Smoking status: Former    Current packs/day: 0.00    Types: Cigarettes    Quit date: 07/07/2014    Years since quitting: 9.1   Smokeless tobacco: Never  Vaping Use   Vaping status: Never Used  Substance and Sexual Activity   Alcohol use: Yes    Alcohol/week: 0.0 standard drinks of alcohol    Comment: rare   Drug use: No   Sexual activity: Not Currently    Birth control/protection: Post-menopausal    Comment: 1st intercourse 73 yo-Fewer than 5 partners  Other Topics Concern   Not on file  Social History Narrative   Not on file   Social Drivers of Health   Financial Resource Strain: Not on file  Food Insecurity: No Food Insecurity (03/14/2022)   Hunger Vital Sign    Worried About Running Out of Food in the Last Year: Never true    Ran Out of Food in the Last Year: Never true  Transportation Needs: No Transportation Needs (03/14/2022)   PRAPARE - Transportation    Lack of Transportation (Medical): No    Lack of Transportation (Non-Medical): No  Physical Activity: Insufficiently Active (02/23/2023)   Exercise Vital Sign    Days  of Exercise per Week: 1 day    Minutes of Exercise per Session: 60 min  Stress: No Stress Concern Present (02/23/2023)   [employer] of Occupational Health - Occupational Stress Questionnaire    Feeling of Stress : Not at all  Social Connections: Moderately Integrated (02/23/2023)   Social Connection and Isolation Panel [NHANES]    Frequency of Communication with Friends and Family: More than three times a week    Frequency of Social Gatherings with Friends and Family: Twice a week    Attends Religious Services: Never    Database administrator or Organizations: Yes    Attends Engineer, structural: More than 4 times per year    Marital Status: Married   Allergies  Allergen Reactions   Chlorhexidine Gluconate     Swelling  of tongue ( mouth rinse )   Clindamycin/Lincomycin Diarrhea   Red Dye #40 (Allura Red) Diarrhea and Nausea And Vomiting   Family History  Problem Relation Age of Onset   Arthritis Mother    Hypertension Mother    Arthritis Brother    Heart disease Brother    Stomach cancer Maternal Grandmother 60   Colon cancer Maternal Grandfather 61   Heart disease Paternal Grandmother    Colon cancer Paternal Grandfather 7   Breast cancer Neg Hx    Colon polyps Neg Hx    Esophageal cancer Neg Hx    Rectal cancer Neg Hx     Current Outpatient Medications (Endocrine & Metabolic):    calcitonin, salmon, (MIACALCIN/FORTICAL) 200 UNIT/ACT nasal spray, Place 2 sprays into alternate nostrils daily.   levothyroxine (SYNTHROID) 100 MCG tablet, Take 1 tablet (100 mcg total) by mouth daily.  Current Outpatient Medications (Cardiovascular):    rosuvastatin (CRESTOR) 10 MG tablet, Take 1 tablet by mouth  daily  Current Outpatient Medications (Respiratory):    triamcinolone (NASACORT AQ) 55 MCG/ACT AERO nasal inhaler, Place 2 sprays into the nose daily.  Current Outpatient Medications (Analgesics):    acetaminophen (TYLENOL) 325 MG tablet, Take by mouth.   Current Outpatient Medications (Other):    Vitamin D, Ergocalciferol, (DRISDOL) 1.25 MG (50000 UNIT) CAPS capsule, Take 1 capsule (50,000 Units total) by mouth every 7 (seven) days.   Calcium Carbonate-Vit D-Min (CALTRATE 600+D PLUS PO), Take by mouth.   Calcium Citrate-Vitamin D (CITRACAL + D PO), Take 1 tablet by mouth daily.   gabapentin (NEURONTIN) 100 MG capsule, TAKE 2 CAPSULES BY MOUTH TWICE  DAILY   metroNIDAZOLE (METROGEL) 0.75 % gel, Apply 1 Application topically 2 (two) times daily.   tamoxifen (NOLVADEX) 10 MG tablet, Take 20 mg by mouth daily.   triamcinolone cream (KENALOG) 0.1 %, Apply 1 Application topically 2 (two) times daily.   VITAMIN D PO, Take by mouth. Taking with calcium and vitamin D combination tab.   Reviewed prior  external information including notes and imaging from  primary care provider As well as notes that were available from care everywhere and other healthcare systems.  Past medical history, social, surgical and family history all reviewed in electronic medical record.  No pertanent information unless stated regarding to the chief complaint.   Review of Systems:  No headache, visual changes, nausea, vomiting, diarrhea, constipation, dizziness, abdominal pain, skin rash, fevers, chills, night sweats, weight loss, swollen lymph nodes, body aches, joint swelling, chest pain, shortness of breath, mood changes. POSITIVE muscle aches  Objective  Pulse 92, height 5\' 6"  (1.676 m), weight 147 lb (66.7 kg), SpO2 98%.  General: No apparent distress alert and oriented x3 mood and affect normal, dressed appropriately.  HEENT: Pupils equal, extraocular movements intact  Respiratory: Patient's speak in full sentences and does not appear short of breath  Cardiovascular: No lower extremity edema, non tender, no erythema  Back exam shows still severe tenderness in the mid back mostly at the inferior angles of the scapula in the soft tissue and severe in the midline.  Neurovascularly intact though in the upper extremities.    Impression and Recommendations:     The above documentation has been reviewed and is accurate and complete Judi Saa, DO

## 2023-08-20 ENCOUNTER — Encounter: Payer: Self-pay | Admitting: Family Medicine

## 2023-08-20 ENCOUNTER — Ambulatory Visit (INDEPENDENT_AMBULATORY_CARE_PROVIDER_SITE_OTHER): Payer: Medicare Other | Admitting: Family Medicine

## 2023-08-20 VITALS — HR 92 | Ht 66.0 in | Wt 147.0 lb

## 2023-08-20 DIAGNOSIS — S22000A Wedge compression fracture of unspecified thoracic vertebra, initial encounter for closed fracture: Secondary | ICD-10-CM | POA: Diagnosis not present

## 2023-08-20 DIAGNOSIS — M5136 Other intervertebral disc degeneration, lumbar region with discogenic back pain only: Secondary | ICD-10-CM

## 2023-08-20 DIAGNOSIS — E559 Vitamin D deficiency, unspecified: Secondary | ICD-10-CM

## 2023-08-20 MED ORDER — VITAMIN D (ERGOCALCIFEROL) 1.25 MG (50000 UNIT) PO CAPS: 50000.0000 [IU] | ORAL_CAPSULE | ORAL | 0 refills | Status: AC

## 2023-08-20 NOTE — Patient Instructions (Signed)
Once weekly vitamin D I prescribed  We will order a bone density  Will see if we can get you in with endocrinology  Still be very careful Wich you the best with your husband See me agai in 4-6 weeks

## 2023-08-20 NOTE — Assessment & Plan Note (Addendum)
Patient's MRI does show the acute fracture of the T7.  Patient wants to hold on the kyphoplasty after discussing.  Once weekly vitamin D.  Discussed potential K2 over-the-counter.  Patient is doing the calcitonin and hopefully that this will make some improvement.  Discussed that that would be done for a total of 1 month.  Will take 3 to 4 months to fully heal.  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  Discussed though that she cannot do any heavy lifting for quite some time.  Follow-up with me again in 4 weeks otherwise.  At that point we will consider possibly formal physical therapy.  Worsening pain patient does need to seek medical attention.  Bone density ordered today as well.  Patient is nearly 2 years out at this time.  Previously was -1.9 but now having acute fracture.  Do think patient is osteoporotic.  May need endocrinology further evaluation.

## 2023-08-21 ENCOUNTER — Ambulatory Visit (INDEPENDENT_AMBULATORY_CARE_PROVIDER_SITE_OTHER)
Admission: RE | Admit: 2023-08-21 | Discharge: 2023-08-21 | Disposition: A | Discharge status: Home / Self Care | Payer: Medicare Other | Source: Ambulatory Visit | Attending: Family Medicine | Admitting: Family Medicine

## 2023-08-21 DIAGNOSIS — M5136 Other intervertebral disc degeneration, lumbar region with discogenic back pain only: Secondary | ICD-10-CM | POA: Diagnosis not present

## 2023-08-21 DIAGNOSIS — S22000A Wedge compression fracture of unspecified thoracic vertebra, initial encounter for closed fracture: Secondary | ICD-10-CM

## 2023-08-22 ENCOUNTER — Encounter: Payer: Self-pay | Admitting: Family Medicine

## 2023-08-22 ENCOUNTER — Other Ambulatory Visit: Payer: Self-pay | Admitting: Family Medicine

## 2023-08-22 DIAGNOSIS — M8580 Other specified disorders of bone density and structure, unspecified site: Secondary | ICD-10-CM

## 2023-08-22 DIAGNOSIS — S22000A Wedge compression fracture of unspecified thoracic vertebra, initial encounter for closed fracture: Secondary | ICD-10-CM

## 2023-08-28 ENCOUNTER — Other Ambulatory Visit: Payer: Self-pay | Admitting: Family Medicine

## 2023-09-13 NOTE — Progress Notes (Signed)
 [name] [address] [place] [address] Phone: [phone] Subjective:   Bruce Donath, am serving as a scribe for Dr. Antoine Primas.  I'm seeing this patient by the request  of:  Nelwyn Salisbury, MD  CC: thoracic and lower back pain follow up   [id number]  08/20/2023 Patient's MRI does show the acute fracture of the T7.  Patient wants to hold on the kyphoplasty after discussing.  Once weekly vitamin D.  Discussed potential K2 over-the-counter.  Patient is doing the calcitonin and hopefully that this will make some improvement.  Discussed that that would be done for a total of 1 month.  Will take 3 to 4 months to fully heal.  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  Discussed though that she cannot do any heavy lifting for quite some time.  Follow-up with me again in 4 weeks otherwise.  At that point we will consider possibly formal physical therapy.  Worsening pain patient does need to seek medical attention.   Bone density ordered today as well.  Patient is nearly 2 years out at this time.  Previously was -1.9 but now having acute fracture.  Do think patient is osteoporotic.  May need endocrinology further evaluation.  Update 09/17/2023 EEVA SCHLOSSER is a 73 y.o. female coming in with complaint of T spine compression fx. Patient states that she is doing a lot better. Has twinges of pain intermittently and pain will occur if she has done a lot of lumbar flexion.   R knee is doing better. Went to grocery store on Friday and had pain that night.        Past Medical History:  Diagnosis Date   Allergy    Arthritis    Cancer (HCC) 05/2021   Breast   Cataract    COPD (chronic obstructive pulmonary disease) (HCC)    Hyperlipidemia    Osteopenia 07/2018   T score -1.3 FRAX 9% / 1.1%   Osteoporosis    Personal history of radiation therapy    Thyroid disease    Past  Surgical History:  Procedure Laterality Date   BREAST BIOPSY Left 05/2021   BREAST LUMPECTOMY Left 08/16/2021   with lymph node dissection   BROKEN LEG     COLONOSCOPY  05/18/2022   per Dr. Russella Dar, adenomatous polyps, repeat in 7 yrs   NOSE SURGERY     Social History   Socioeconomic History   Marital status: Married    Spouse name: Not on file   Number of children: Not on file   Years of education: Not on file   Highest education level: Not on file  Occupational History   Not on file  Tobacco Use   Smoking status: Former    Current packs/day: 0.00    Types: Cigarettes    Quit date: 07/07/2014    Years since quitting: 9.2   Smokeless tobacco: Never  Vaping Use   Vaping status: Never Used  Substance and Sexual Activity   Alcohol use: Yes    Alcohol/week: 0.0 standard drinks of alcohol    Comment: rare   Drug use: No   Sexual activity: Not Currently    Birth control/protection: Post-menopausal    Comment: 1st intercourse 73 yo-Fewer than 5 partners  Other Topics Concern   Not on file  Social History Narrative   Not on file   Social Drivers of  Health   Financial Resource Strain: Not on file  Food Insecurity: No Food Insecurity (03/14/2022)   Hunger Vital Sign    Worried About Running Out of Food in the Last Year: Never true    Ran Out of Food in the Last Year: Never true  Transportation Needs: No Transportation Needs (03/14/2022)   PRAPARE - Administrator, Civil Service (Medical): No    Lack of Transportation (Non-Medical): No  Physical Activity: Insufficiently Active (02/23/2023)   Exercise Vital Sign    Days of Exercise per Week: 1 day    Minutes of Exercise per Session: 60 min  Stress: No Stress Concern Present (02/23/2023)   [employer] of Occupational Health - Occupational Stress Questionnaire    Feeling of Stress : Not at all  Social Connections: Moderately Integrated (02/23/2023)   Social Connection and Isolation Panel [NHANES]    Frequency of  Communication with Friends and Family: More than three times a week    Frequency of Social Gatherings with Friends and Family: Twice a week    Attends Religious Services: Never    Database administrator or Organizations: Yes    Attends Engineer, structural: More than 4 times per year    Marital Status: Married   Allergies  Allergen Reactions   Chlorhexidine Gluconate     Swelling of tongue ( mouth rinse )   Clindamycin/Lincomycin Diarrhea   Red Dye #40 (Allura Red) Diarrhea and Nausea And Vomiting   Family History  Problem Relation Age of Onset   Arthritis Mother    Hypertension Mother    Arthritis Brother    Heart disease Brother    Stomach cancer Maternal Grandmother 60   Colon cancer Maternal Grandfather 38   Heart disease Paternal Grandmother    Colon cancer Paternal Grandfather 26   Breast cancer Neg Hx    Colon polyps Neg Hx    Esophageal cancer Neg Hx    Rectal cancer Neg Hx     Current Outpatient Medications (Endocrine & Metabolic):    calcitonin, salmon, (MIACALCIN/FORTICAL) 200 UNIT/ACT nasal spray, Place 2 sprays into alternate nostrils daily.   levothyroxine (SYNTHROID) 100 MCG tablet, Take 1 tablet (100 mcg total) by mouth daily.  Current Outpatient Medications (Cardiovascular):    rosuvastatin (CRESTOR) 10 MG tablet, Take 1 tablet by mouth  daily  Current Outpatient Medications (Respiratory):    triamcinolone (NASACORT AQ) 55 MCG/ACT AERO nasal inhaler, Place 2 sprays into the nose daily.  Current Outpatient Medications (Analgesics):    acetaminophen (TYLENOL) 325 MG tablet, Take by mouth.   Current Outpatient Medications (Other):    Calcium Carbonate-Vit D-Min (CALTRATE 600+D PLUS PO), Take by mouth.   Calcium Citrate-Vitamin D (CITRACAL + D PO), Take 1 tablet by mouth daily.   gabapentin (NEURONTIN) 100 MG capsule, TAKE 2 CAPSULES BY MOUTH TWICE  DAILY   metroNIDAZOLE (METROGEL) 0.75 % gel, Apply 1 Application topically 2 (two) times daily.    tamoxifen (NOLVADEX) 10 MG tablet, Take 20 mg by mouth daily.   triamcinolone cream (KENALOG) 0.1 %, Apply 1 Application topically 2 (two) times daily.   VITAMIN D PO, Take by mouth. Taking with calcium and vitamin D combination tab.   Vitamin D, Ergocalciferol, (DRISDOL) 1.25 MG (50000 UNIT) CAPS capsule, Take 1 capsule (50,000 Units total) by mouth every 7 (seven) days.   Reviewed prior external information including notes and imaging from  primary care provider As well as notes that were available from care  everywhere and other healthcare systems.  Past medical history, social, surgical and family history all reviewed in electronic medical record.  No pertanent information unless stated regarding to the chief complaint.   Review of Systems:  No headache, visual changes, nausea, vomiting, diarrhea, constipation, dizziness, abdominal pain, skin rash, fevers, chills, night sweats, weight loss, swollen lymph nodes, body aches, joint swelling, chest pain, shortness of breath, mood changes. POSITIVE muscle aches  Objective  Blood pressure 108/62, pulse 75, height 5\' 6"  (1.676 m), weight 146 lb (66.2 kg), SpO2 97%.   General: No apparent distress alert and oriented x3 mood and affect normal, dressed appropriately.  HEENT: Pupils equal, extraocular movements intact  Respiratory: Patient's speak in full sentences and does not appear short of breath  Cardiovascular: No lower extremity edema, non tender, no erythema  Does have tightness in the thoracic area no pinpoint tenderness noted. Positive Charlene Brooke on the right side.     Impression and Recommendations:    The above documentation has been reviewed and is accurate and complete Judi Saa, DO

## 2023-09-17 ENCOUNTER — Ambulatory Visit (INDEPENDENT_AMBULATORY_CARE_PROVIDER_SITE_OTHER): Payer: Medicare Other | Admitting: Family Medicine

## 2023-09-17 ENCOUNTER — Encounter: Payer: Self-pay | Admitting: Family Medicine

## 2023-09-17 VITALS — BP 108/62 | HR 75 | Ht 66.0 in | Wt 146.0 lb

## 2023-09-17 DIAGNOSIS — M5136 Other intervertebral disc degeneration, lumbar region with discogenic back pain only: Secondary | ICD-10-CM | POA: Diagnosis not present

## 2023-09-17 NOTE — Assessment & Plan Note (Signed)
 Continue to monitor.  Discussed with patient about the thoracic fracture noted.  Discussed icing regimen and home exercises.  Discussed which activities to avoid.  Increase activity slowly otherwise.  Patient does need some help with her husband who is in a wheelchair.  Wants to make sure she does not have any significant worsening pain at the moment.  Still waiting endocrinology for the fracture.  Follow-up again in 6 to 8 weeks.

## 2023-09-17 NOTE — Patient Instructions (Addendum)
  Value based care number See me in 2-3 months

## 2023-10-17 DIAGNOSIS — H43811 Vitreous degeneration, right eye: Secondary | ICD-10-CM | POA: Diagnosis not present

## 2023-10-17 DIAGNOSIS — H2513 Age-related nuclear cataract, bilateral: Secondary | ICD-10-CM | POA: Diagnosis not present

## 2023-10-17 DIAGNOSIS — H5213 Myopia, bilateral: Secondary | ICD-10-CM | POA: Diagnosis not present

## 2023-11-23 NOTE — Progress Notes (Unsigned)
 [facility] [address] [place] [address] Phone: [phone] Subjective:   Delwyn Filippo, am serving as a scribe for Dr. Ronnell Coins.  I'm seeing this patient by the request  of:  Donley Furth, MD  CC: Back pain follow-up overall doing well.  ION:[id number]  09/17/2023 Continue to monitor.  Discussed with patient about the thoracic fracture noted.  Discussed icing regimen and home exercises.  Discussed which activities to avoid.  Increase activity slowly otherwise.  Patient does need some help with her husband who is in a wheelchair.  Wants to make sure she does not have any significant worsening pain at the moment.  Still waiting endocrinology for the fracture.  Follow-up again in 6 to 8 weeks.     Updated 11/26/2023 BRISA PRATT is a 73 y.o. female coming in with complaint of back pain. Patient states that she is doing much better. Has some days when she gets sore. Tries to be extra cautious when lifting heavier items.  Patient states that she is able to do much more in daily activities.  Feels like she has more confidence.  Complaining of some mild right knee pain.       Past Medical History:  Diagnosis Date   Allergy    Arthritis    Cancer (HCC) 05/2021   Breast   Cataract    COPD (chronic obstructive pulmonary disease) (HCC)    Hyperlipidemia    Osteopenia 07/2018   T score -1.3 FRAX 9% / 1.1%   Osteoporosis    Personal history of radiation therapy    Thyroid  disease    Past Surgical History:  Procedure Laterality Date   BREAST BIOPSY Left 05/2021   BREAST LUMPECTOMY Left 08/16/2021   with lymph node dissection   BROKEN LEG     COLONOSCOPY  05/18/2022   per Dr. Sandrea Cruel, adenomatous polyps, repeat in 7 yrs   NOSE SURGERY     Social History   Socioeconomic History   Marital status: Married    Spouse name: Not on file   Number of children: Not on file   Years of education: Not on file   Highest education level: Not  on file  Occupational History   Not on file  Tobacco Use   Smoking status: Former    Current packs/day: 0.00    Types: Cigarettes    Quit date: 07/07/2014    Years since quitting: 9.3   Smokeless tobacco: Never  Vaping Use   Vaping status: Never Used  Substance and Sexual Activity   Alcohol use: Yes    Alcohol/week: 0.0 standard drinks of alcohol    Comment: rare   Drug use: No   Sexual activity: Not Currently    Birth control/protection: Post-menopausal    Comment: 1st intercourse 73 yo-Fewer than 5 partners  Other Topics Concern   Not on file  Social History Narrative   Not on file   Social Drivers of Health   Financial Resource Strain: Not on file  Food Insecurity: No Food Insecurity (03/14/2022)   Hunger Vital Sign    Worried About Running Out of Food in the Last Year: Never true    Ran Out of Food in the Last Year: Never true  Transportation Needs: No Transportation Needs (03/14/2022)   PRAPARE - Transportation    Lack of Transportation (Medical): No    Lack of Transportation (Non-Medical): No  Physical Activity: Insufficiently Active (02/23/2023)   Exercise Vital Sign  Days of Exercise per Week: 1 day    Minutes of Exercise per Session: 60 min  Stress: No Stress Concern Present (02/23/2023)   [employer] of Occupational Health - Occupational Stress Questionnaire    Feeling of Stress : Not at all  Social Connections: Moderately Integrated (02/23/2023)   Social Connection and Isolation Panel [NHANES]    Frequency of Communication with Friends and Family: More than three times a week    Frequency of Social Gatherings with Friends and Family: Twice a week    Attends Religious Services: Never    Database administrator or Organizations: Yes    Attends Engineer, structural: More than 4 times per year    Marital Status: Married   Allergies  Allergen Reactions   Chlorhexidine Gluconate     Swelling of tongue ( mouth rinse )   Clindamycin/Lincomycin  Diarrhea   Red Dye #40 (Allura Red) Diarrhea and Nausea And Vomiting   Family History  Problem Relation Age of Onset   Arthritis Mother    Hypertension Mother    Arthritis Brother    Heart disease Brother    Stomach cancer Maternal Grandmother 60   Colon cancer Maternal Grandfather 63   Heart disease Paternal Grandmother    Colon cancer Paternal Grandfather 36   Breast cancer Neg Hx    Colon polyps Neg Hx    Esophageal cancer Neg Hx    Rectal cancer Neg Hx     Current Outpatient Medications (Endocrine & Metabolic):    calcitonin, salmon, (MIACALCIN/FORTICAL) 200 UNIT/ACT nasal spray, Place 2 sprays into alternate nostrils daily.   levothyroxine  (SYNTHROID ) 100 MCG tablet, Take 1 tablet (100 mcg total) by mouth daily.  Current Outpatient Medications (Cardiovascular):    rosuvastatin  (CRESTOR ) 10 MG tablet, Take 1 tablet by mouth  daily  Current Outpatient Medications (Respiratory):    triamcinolone  (NASACORT  AQ) 55 MCG/ACT AERO nasal inhaler, Place 2 sprays into the nose daily.  Current Outpatient Medications (Analgesics):    acetaminophen (TYLENOL) 325 MG tablet, Take by mouth.   Current Outpatient Medications (Other):    Calcium  Carbonate-Vit D-Min (CALTRATE 600+D PLUS PO), Take by mouth.   Calcium  Citrate-Vitamin D  (CITRACAL + D PO), Take 1 tablet by mouth daily.   gabapentin  (NEURONTIN ) 100 MG capsule, TAKE 2 CAPSULES BY MOUTH TWICE  DAILY   metroNIDAZOLE  (METROGEL ) 0.75 % gel, Apply 1 Application topically 2 (two) times daily.   tamoxifen (NOLVADEX) 10 MG tablet, Take 20 mg by mouth daily.   triamcinolone  cream (KENALOG ) 0.1 %, Apply 1 Application topically 2 (two) times daily.   VITAMIN D  PO, Take by mouth. Taking with calcium  and vitamin D  combination tab.   Vitamin D , Ergocalciferol , (DRISDOL ) 1.25 MG (50000 UNIT) CAPS capsule, Take 1 capsule (50,000 Units total) by mouth every 7 (seven) days.   Reviewed prior external information including notes and imaging from   primary care provider As well as notes that were available from care everywhere and other healthcare systems.  Past medical history, social, surgical and family history all reviewed in electronic medical record.  No pertanent information unless stated regarding to the chief complaint.   Review of Systems:  No headache, visual changes, nausea, vomiting, diarrhea, constipation, dizziness, abdominal pain, skin rash, fevers, chills, night sweats, weight loss, swollen lymph nodes, body aches, joint swelling, chest pain, shortness of breath, mood changes. POSITIVE muscle aches  Objective  Blood pressure 118/60, pulse 88, height 5\' 6"  (1.676 m), weight 146 lb (66.2 kg),  SpO2 98%.   General: No apparent distress alert and oriented x3 mood and affect normal, dressed appropriately.  HEENT: Pupils equal, extraocular movements intact  Respiratory: Patient's speak in full sentences and does not appear short of breath  Cardiovascular: No lower extremity edema, non tender, no erythema  Patient's mid back does not seem to be very tender today.  No findings of any type of specific areas of the tender's.  Right knee does have some lateral tracking noted.  Some mild crepitus noted.  Positive patellar grind test noted.    Impression and Recommendations:     The above documentation has been reviewed and is accurate and complete Jeliyah Middlebrooks M Daryel Kenneth, DO

## 2023-11-26 ENCOUNTER — Ambulatory Visit (INDEPENDENT_AMBULATORY_CARE_PROVIDER_SITE_OTHER): Payer: Medicare Other | Admitting: Family Medicine

## 2023-11-26 VITALS — BP 118/60 | HR 88 | Ht 66.0 in | Wt 146.0 lb

## 2023-11-26 DIAGNOSIS — M222X1 Patellofemoral disorders, right knee: Secondary | ICD-10-CM

## 2023-11-26 DIAGNOSIS — S22000A Wedge compression fracture of unspecified thoracic vertebra, initial encounter for closed fracture: Secondary | ICD-10-CM | POA: Diagnosis not present

## 2023-11-26 NOTE — Assessment & Plan Note (Signed)
 Mild exacerbation.  Does have known arthritic changes.  Discussed potential different treatment options including multiple different types of injections.  Discussed icing regimen and home exercises, discussed avoiding certain activities.  Discussed potential sleeping positions that I think will be beneficial.  Follow-up again 2 to 3 months to further evaluate.

## 2023-11-26 NOTE — Assessment & Plan Note (Signed)
 Completely healed at this time.  No change in management.  Patient is able to increase activity as tolerated.

## 2023-11-26 NOTE — Patient Instructions (Signed)
 Knee cut out compression sleeve Body [email or web address] Ice after activity and before bed PRP handout [employer] handout See me in 2-3 months

## 2023-12-07 NOTE — Progress Notes (Signed)
 73 y.o. G12P0000 Married Caucasian female here for a breast and pelvic exam.    Taking Tamoxifen for breast cancer.  She takes 10 mg instead of 20 mg.   Had a stress fracture of her spine.  Has also had a stress fracture of her foot.  She will see endocrinology today.   Not sexually active.   Married for 50 years.  Patient is a caregiver for her.   PCP: Donley Furth, MD   No LMP recorded. Patient is postmenopausal.           Sexually active: No.  The current method of family planning is post menopausal status.    Menopausal hormone therapy:  n/a Exercising: Yes.    Exercising class 1x week Smoker:  Former  OB History     Gravida  0   Para  0   Term  0   Preterm  0   AB  0   Living  0      SAB  0   IAB  0   Ectopic  0   Multiple  0   Live Births  0           HEALTH MAINTENANCE: Last 2 paps: 09/12/21 neg, 09/24/13 neg  History of abnormal Pap or positive HPV:  no Mammogram:  06/07/23 Breast Density Cat B, BIRADS Cat 2 benign. She will at [facility] in January, 2026. Colonoscopy:  05/18/22 Bone Density:  08/21/23  Result  osteopenia of bilateral hips - FRAX:  18.1%/4%.  [place].   Immunization History  Administered Date(s) Administered   Fluad Quad(high Dose 65+) 05/20/2019, 04/28/2021, 05/01/2022   Fluad Trivalent(High Dose 65+) 04/16/2023   Influenza Split 05/11/2011, 05/08/2012   Influenza Whole 05/03/2007, 05/05/2008, 04/28/2009, 04/20/2010   Influenza, High Dose Seasonal PF 05/06/2014, 05/13/2015, 05/17/2016, 05/15/2017, 05/10/2018   Influenza,inj,Quad PF,6+ Mos 05/07/2013   PFIZER Comirnaty(Gray Top)Covid-19 Tri-Sucrose Vaccine 09/04/2019, 09/25/2019   PFIZER(Purple Top)SARS-COV-2 Vaccination 05/20/2020   Pneumococcal Conjugate-13 01/21/2019   Pneumococcal Polysaccharide-23 02/01/2021   Tdap 12/22/2010   Zoster Recombinant(Shingrix) 04/09/2019, 09/03/2019   Zoster, Live 01/01/2013      reports that she quit smoking about 9 years ago. Her  smoking use included cigarettes. She has never used smokeless tobacco. She reports current alcohol use. She reports that she does not use drugs.  Past Medical History:  Diagnosis Date   Allergy    Arthritis    Cancer (HCC) 05/2021   Breast   Cataract    Compression fracture of spine (HCC)    COPD (chronic obstructive pulmonary disease) (HCC)    Hyperlipidemia    Osteopenia 07/2018   T score -1.3 FRAX 9% / 1.1%   Osteoporosis    Personal history of radiation therapy    Thyroid  disease     Past Surgical History:  Procedure Laterality Date   BREAST BIOPSY Left 05/2021   BREAST LUMPECTOMY Left 08/16/2021   with lymph node dissection   BROKEN LEG     COLONOSCOPY  05/18/2022   per Dr. Sandrea Cruel, adenomatous polyps, repeat in 7 yrs   NOSE SURGERY      Current Outpatient Medications  Medication Sig Dispense Refill   acetaminophen (TYLENOL) 325 MG tablet Take by mouth.     calcitonin, salmon, (MIACALCIN/FORTICAL) 200 UNIT/ACT nasal spray Place 2 sprays into alternate nostrils daily. 5.4 mL 0   Calcium  Carbonate-Vit D-Min (CALTRATE 600+D PLUS PO) Take by mouth.     Calcium  Citrate-Vitamin D  (CITRACAL + D PO) Take 1  tablet by mouth daily.     Diclofenac  Sodium (VOLTAREN  PO) Take by mouth.     gabapentin  (NEURONTIN ) 100 MG capsule TAKE 2 CAPSULES BY MOUTH TWICE  DAILY 360 capsule 3   levothyroxine  (SYNTHROID ) 100 MCG tablet Take 1 tablet (100 mcg total) by mouth daily. 90 tablet 3   rosuvastatin  (CRESTOR ) 10 MG tablet Take 1 tablet by mouth  daily 90 tablet 3   tamoxifen (NOLVADEX) 10 MG tablet Take 20 mg by mouth daily.     triamcinolone  (NASACORT  AQ) 55 MCG/ACT AERO nasal inhaler Place 2 sprays into the nose daily. 3 Inhaler 3   triamcinolone  cream (KENALOG ) 0.1 % Apply 1 Application topically 2 (two) times daily. 45 g 5   VITAMIN D  PO Take by mouth. Taking with calcium  and vitamin D  combination tab.     Vitamin D , Ergocalciferol , (DRISDOL ) 1.25 MG (50000 UNIT) CAPS capsule Take 1  capsule (50,000 Units total) by mouth every 7 (seven) days. 12 capsule 0   metroNIDAZOLE  (METROGEL ) 0.75 % gel Apply 1 Application topically 2 (two) times daily. (Patient not taking: Reported on 12/11/2023) 45 g 5   No current facility-administered medications for this visit.    ALLERGIES: Chlorhexidine gluconate, Clindamycin/lincomycin, and Red dye #40 (allura red)  Family History  Problem Relation Age of Onset   Arthritis Mother    Hypertension Mother    Arthritis Brother    Heart disease Brother    Stomach cancer Maternal Grandmother 59   Colon cancer Maternal Grandfather 12   Heart disease Paternal Grandmother    Colon cancer Paternal Grandfather 63   Breast cancer Neg Hx    Colon polyps Neg Hx    Esophageal cancer Neg Hx    Rectal cancer Neg Hx     Review of Systems  All other systems reviewed and are negative.   PHYSICAL EXAM:  BP 112/64 (BP Location: Left Arm, Patient Position: Sitting)   Pulse 94   Ht 5\' 7"  (1.702 m)   Wt 145 lb (65.8 kg)   SpO2 98%   BMI 22.71 kg/m     General appearance: alert, cooperative and appears stated age Head: normocephalic, without obvious abnormality, atraumatic Neck: no adenopathy, supple, symmetrical, trachea midline and thyroid  normal to inspection and palpation Lungs: clear to auscultation bilaterally Breasts: normal appearance, no masses or tenderness, No nipple retraction or dimpling, No nipple discharge or bleeding, No axillary adenopathy Heart: regular rate and rhythm Abdomen: soft, non-tender; no masses, no organomegaly Extremities: extremities normal, atraumatic, no cyanosis or edema Skin: skin color, texture, turgor normal. No rashes or lesions Lymph nodes: cervical, supraclavicular, and axillary nodes normal. Neurologic: grossly normal  Pelvic: External genitalia:  no lesions              No abnormal inguinal nodes palpated.              Urethra:  normal appearing urethra with no masses, tenderness or lesions               Bartholins and Skenes: normal                 Vagina: normal appearing vagina with normal color and discharge, no lesions              Cervix: no lesions              Pap taken: yes Bimanual Exam:  Uterus:  normal size, contour, position, consistency, mobility, non-tender  Adnexa: no mass, fullness, tenderness              Rectal exam: yes.  Confirms.              Anus:  normal sphincter tone, no lesions  Chaperone was present for exam:  Cottie Diss, CMA  ASSESSMENT: Encounter for breast and pelvic exam.  Personal history of other medical treatment.  Hx left breast cancer.  Status post lumpectomy and XRT.   On Tamoxifen.  Personal history of other medical treatment.  Osteoporosis.   Hx stress fracture.   PLAN: Mammogram screening discussed. Self breast awareness reviewed. Pap and reflex HRV collected:  yes Guidelines for Calcium , Vitamin D , regular exercise program including cardiovascular and weight bearing exercise. Medication refills:  NA Patient educated regarding tamoxifen effect on endometrium and the importance of contacting the office for any postmenopausal bleeding.  Follow up:  yearly and prn.    Additional counseling given.  yes. 20 min  total time was spent for this patient encounter, including preparation, face-to-face counseling with the patient, coordination of care, and documentation of the encounter in addition to doing the breast and pelvic exam and pap.

## 2023-12-11 ENCOUNTER — Encounter: Payer: Self-pay | Admitting: Obstetrics and Gynecology

## 2023-12-11 ENCOUNTER — Ambulatory Visit (INDEPENDENT_AMBULATORY_CARE_PROVIDER_SITE_OTHER): Payer: Medicare Other | Admitting: Obstetrics and Gynecology

## 2023-12-11 ENCOUNTER — Encounter: Payer: Self-pay | Admitting: Endocrinology

## 2023-12-11 ENCOUNTER — Other Ambulatory Visit (HOSPITAL_COMMUNITY)
Admission: RE | Admit: 2023-12-11 | Discharge: 2023-12-11 | Disposition: A | Discharge status: Home / Self Care | Source: Ambulatory Visit | Attending: Obstetrics and Gynecology | Admitting: Obstetrics and Gynecology

## 2023-12-11 ENCOUNTER — Ambulatory Visit (INDEPENDENT_AMBULATORY_CARE_PROVIDER_SITE_OTHER): Payer: Medicare Other | Admitting: Endocrinology

## 2023-12-11 VITALS — BP 112/64 | HR 94 | Ht 67.0 in | Wt 145.0 lb

## 2023-12-11 VITALS — BP 116/60 | HR 88 | Resp 20 | Ht 67.0 in | Wt 146.6 lb

## 2023-12-11 DIAGNOSIS — Z124 Encounter for screening for malignant neoplasm of cervix: Secondary | ICD-10-CM | POA: Insufficient documentation

## 2023-12-11 DIAGNOSIS — M810 Age-related osteoporosis without current pathological fracture: Secondary | ICD-10-CM

## 2023-12-11 DIAGNOSIS — S22060A Wedge compression fracture of T7-T8 vertebra, initial encounter for closed fracture: Secondary | ICD-10-CM

## 2023-12-11 DIAGNOSIS — M8000XA Age-related osteoporosis with current pathological fracture, unspecified site, initial encounter for fracture: Secondary | ICD-10-CM

## 2023-12-11 DIAGNOSIS — E039 Hypothyroidism, unspecified: Secondary | ICD-10-CM

## 2023-12-11 DIAGNOSIS — M8580 Other specified disorders of bone density and structure, unspecified site: Secondary | ICD-10-CM | POA: Diagnosis not present

## 2023-12-11 DIAGNOSIS — Z853 Personal history of malignant neoplasm of breast: Secondary | ICD-10-CM | POA: Diagnosis not present

## 2023-12-11 DIAGNOSIS — Z9189 Other specified personal risk factors, not elsewhere classified: Secondary | ICD-10-CM | POA: Diagnosis not present

## 2023-12-11 DIAGNOSIS — Z9289 Personal history of other medical treatment: Secondary | ICD-10-CM

## 2023-12-11 DIAGNOSIS — Z87310 Personal history of (healed) osteoporosis fracture: Secondary | ICD-10-CM

## 2023-12-11 DIAGNOSIS — Z01419 Encounter for gynecological examination (general) (routine) without abnormal findings: Secondary | ICD-10-CM

## 2023-12-11 NOTE — Progress Notes (Signed)
 Outpatient Endocrinology Note [place] Jaidan Stachnik, MD   Patient's Name: Stacy Gillespie    DOB: Jan 25, 1951    MRN: [id number]  REASON OF VISIT: New consult for osteoporosis  REFERRING PROVIDER:   PCP: Donley Furth, MD  HISTORY OF PRESENT ILLNESS:   Stacy Gillespie is a 73 y.o. old female with past medical history listed below, is here for new consult of bone health issues / osteoporosis.  Pertinent Bone Health History: Patient is referred for evaluation and management of bone health/osteoporosis.  Patient had T7 compression fracture in November/December 2024 timeframe, MRI on July 24, 2023 consistent with T7 acute endplate compression fracture resulting approximately 30% loss of vertebral body height.  No T7 compression fracture was not related to the significant mechanical trauma.  It happened by pulling her husband from the floor in lean forward position.  She had DEXA scan on August 21, 2023 lowest T-score in the range of osteopenia right femoral neck T-score -1.8, FRAX score: 10 year major osteoporotic risk: 18.1%. 10 year hip fracture risk: 4.0%   Date of study: 08/21/2023, reviewed images. Exam: DUAL X-RAY ABSORPTIOMETRY (DXA) FOR BONE MINERAL DENSITY (BMD) Instrument: [employer] Requesting Provider: Dr. Felipe Horton Indication: Screening for osteoporosis Comparison: none (please note that it is not possible to compare data from different instruments) Clinical data: Pt is a 73 y.o. female with history of thoracic vertebral compression fracture. On calcium  and vitamin D .  Also, on tamoxifen and nasal calcitonin.   Results:   Lumbar spine L1-L4 Femoral neck (FN) 33% distal radius  T-score 0.0 RFN: -1.8 LFN: -1.7 n/a  Assessment: the BMD is low according to the [facility] classification for osteoporosis (see below).  Due to the history of compression fracture, patient has clinical osteoporosis. Fracture risk: HIGH FRAX score: 10 year major osteoporotic risk: 18.1%. 10 year hip  fracture risk: 4.0%. The thresholds for treatment are 20% and 3%, respectively. Comments: the technical quality of the study is good. Evaluation for secondary causes should be considered if clinically indicated.  Recommend optimizing calcium  (1200 mg/day) and vitamin D  (800 IU/day) intake.   # Patient is known to have osteopenia with DEXA scan in May 2023 consistent with osteopenia with lowest T-score of -1.2 at lumbar spine L1-L2 and left femoral neck -1.9.  She had DEXA scan in 2020, 2017, 2015 with a T-score in the range of osteopenia.   Bone Health Concerns:  No smoking, no excessive alcohol intake.  She had glucocorticoid injection into knee 2 or 3 years ago.  No  malabsorption syndrome or liver disorder.  Fracture history - T7 compression fracture with no obvious trauma in December 2024. - Patient reports history of ankle fracture in the past.  Osteoporosis medications: None  Secondary workup for osteoporosis: She had normal parathyroid hormone, vitamin D  level in the past.  Renal function with mildly low EGFR in 50-60's range.  Calcium  intake from supplements: calcium  / Vit 1 tab occasionally.  Dietary calcium  intake: 1 cup milk, cheese.  Current vitamin D  intake: 2000 international units daily.  Relevant comorbidities: No history of bone cancer. No history of external radiation treatment to bone.   No history of diabetes mellitus.  No Rheumatoid arthritis. No history of GERD.  No history of recent invasive dental procedures. Not planning dental procedures in the near future. No history of coronary artery disease, heart failure or stroke.  She has history of left breast cancer status post lumpectomy and radiation therapy in March - April 2023,  on tamoxifen.  # Primary hypothyroidism : She is currently taking levothyroxine  100 mcg daily.  Thyroid  function test in August was mildly low TSH.   Latest Reference Range & Units 02/23/23 09:52  TSH 0.35 - 5.50 uIU/mL 0.20 (L)   Triiodothyronine,Free,Serum 2.3 - 4.2 pg/mL 3.1  T4,Free(Direct) 0.60 - 1.60 ng/dL 5.62  (L): Data is abnormally low  REVIEW OF SYSTEMS:  As per history of present illness.   PAST MEDICAL HISTORY: Past Medical History:  Diagnosis Date   Allergy    Arthritis    Cancer (HCC) 05/2021   Breast   Cataract    Compression fracture of spine (HCC)    COPD (chronic obstructive pulmonary disease) (HCC)    Hyperlipidemia    Osteopenia 07/2018   T score -1.3 FRAX 9% / 1.1%   Osteoporosis    Personal history of radiation therapy    Thyroid  disease     PAST SURGICAL HISTORY: Past Surgical History:  Procedure Laterality Date   BREAST BIOPSY Left 05/2021   BREAST LUMPECTOMY Left 08/16/2021   with lymph node dissection   BROKEN LEG     COLONOSCOPY  05/18/2022   per Dr. Sandrea Cruel, adenomatous polyps, repeat in 7 yrs   NOSE SURGERY      ALLERGIES: Allergies  Allergen Reactions   Chlorhexidine Gluconate     Swelling of tongue ( mouth rinse )   Clindamycin/Lincomycin Diarrhea   Red Dye #40 (Allura Red) Diarrhea and Nausea And Vomiting    FAMILY HISTORY:  Family History  Problem Relation Age of Onset   Arthritis Mother    Hypertension Mother    Arthritis Brother    Heart disease Brother    Stomach cancer Maternal Grandmother 60   Colon cancer Maternal Grandfather 67   Heart disease Paternal Grandmother    Colon cancer Paternal Grandfather 72   Breast cancer Neg Hx    Colon polyps Neg Hx    Esophageal cancer Neg Hx    Rectal cancer Neg Hx     SOCIAL HISTORY: Social History   Socioeconomic History   Marital status: Married    Spouse name: Not on file   Number of children: Not on file   Years of education: Not on file   Highest education level: Not on file  Occupational History   Not on file  Tobacco Use   Smoking status: Former    Current packs/day: 0.00    Types: Cigarettes    Quit date: 07/07/2014    Years since quitting: 9.4   Smokeless tobacco: Never  Vaping  Use   Vaping status: Never Used  Substance and Sexual Activity   Alcohol use: Yes    Alcohol/week: 0.0 standard drinks of alcohol    Comment: rare   Drug use: No   Sexual activity: Not Currently    Birth control/protection: Post-menopausal    Comment: 1st intercourse 73 yo-Fewer than 5 partners  Other Topics Concern   Not on file  Social History Narrative   Not on file   Social Drivers of Health   Financial Resource Strain: Not on file  Food Insecurity: No Food Insecurity (03/14/2022)   Hunger Vital Sign    Worried About Running Out of Food in the Last Year: Never true    Ran Out of Food in the Last Year: Never true  Transportation Needs: No Transportation Needs (03/14/2022)   PRAPARE - Transportation    Lack of Transportation (Medical): No    Lack of Transportation (Non-Medical): No  Physical Activity: Insufficiently Active (02/23/2023)   Exercise Vital Sign    Days of Exercise per Week: 1 day    Minutes of Exercise per Session: 60 min  Stress: No Stress Concern Present (02/23/2023)   [employer] of Occupational Health - Occupational Stress Questionnaire    Feeling of Stress : Not at all  Social Connections: Moderately Integrated (02/23/2023)   Social Connection and Isolation Panel [NHANES]    Frequency of Communication with Friends and Family: More than three times a week    Frequency of Social Gatherings with Friends and Family: Twice a week    Attends Religious Services: Never    Database administrator or Organizations: Yes    Attends Engineer, structural: More than 4 times per year    Marital Status: Married    MEDICATIONS:  Current Outpatient Medications  Medication Sig Dispense Refill   acetaminophen (TYLENOL) 325 MG tablet Take by mouth.     calcitonin, salmon, (MIACALCIN/FORTICAL) 200 UNIT/ACT nasal spray Place 2 sprays into alternate nostrils daily. 5.4 mL 0   Calcium  Carbonate-Vit D-Min (CALTRATE 600+D PLUS PO) Take by mouth.     Calcium   Citrate-Vitamin D  (CITRACAL + D PO) Take 1 tablet by mouth daily.     Diclofenac  Sodium (VOLTAREN  PO) Take by mouth.     gabapentin  (NEURONTIN ) 100 MG capsule TAKE 2 CAPSULES BY MOUTH TWICE  DAILY 360 capsule 3   levothyroxine  (SYNTHROID ) 100 MCG tablet Take 1 tablet (100 mcg total) by mouth daily. 90 tablet 3   rosuvastatin  (CRESTOR ) 10 MG tablet Take 1 tablet by mouth  daily 90 tablet 3   tamoxifen (NOLVADEX) 10 MG tablet Take 20 mg by mouth daily.     triamcinolone  (NASACORT  AQ) 55 MCG/ACT AERO nasal inhaler Place 2 sprays into the nose daily. 3 Inhaler 3   triamcinolone  cream (KENALOG ) 0.1 % Apply 1 Application topically 2 (two) times daily. 45 g 5   VITAMIN D  PO Take by mouth. Taking with calcium  and vitamin D  combination tab.     Vitamin D , Ergocalciferol , (DRISDOL ) 1.25 MG (50000 UNIT) CAPS capsule Take 1 capsule (50,000 Units total) by mouth every 7 (seven) days. 12 capsule 0   No current facility-administered medications for this visit.    PHYSICAL EXAM: Vitals:   12/11/23 1413  BP: 116/60  Pulse: 88  Resp: 20  SpO2: 98%  Weight: 146 lb 9.6 oz (66.5 kg)  Height: 5\' 7"  (1.702 m)   Body mass index is 22.96 kg/m.  Wt Readings from Last 3 Encounters:  12/11/23 146 lb 9.6 oz (66.5 kg)  12/11/23 145 lb (65.8 kg)  11/26/23 146 lb (66.2 kg)    General: Well developed, well nourished female in no apparent distress.  HEENT: AT/[place], no external lesions. Hearing intact to the spoken word Eyes: EOMI. Conjunctiva clear and no icterus. Neck: Trachea midline, neck supple without appreciable thyromegaly or lymphadenopathy and no palpable thyroid  nodules Lungs: Clear to auscultation, no wheeze. Respirations not labored Heart: S1S2, Regular in rate and rhythm. No loud murmurs Abdomen: Soft, non tender, non distended Neurologic: Alert, oriented, normal speech, deep tendon biceps reflexes normal,  no gross focal neurological deficit Extremities: No pedal pitting edema, no tremors of  outstretched hands. No spine tenderness Skin: Warm, color good.  Psychiatric: Does not appear depressed or anxious  PERTINENT HISTORIC LABORATORY AND IMAGING STUDIES:  All pertinent laboratory results were reviewed. Please see HPI also for further details.   Lab Results  Component Value  Date   ALKPHOS 72 07/23/2023   ALKPHOS 69 02/23/2023   ALKPHOS 109 02/01/2021     ASSESSMENT / PLAN  1. Postmenopausal osteoporosis   2. Primary hypothyroidism   3. Age-related osteoporosis with current pathological fracture, initial encounter   4. Compression fracture of T7 vertebra, initial encounter [facility])     Ms. Lavalle is a 73 y.o. old female with osteoporosis based on history of fragility fractures.  Patient had T7 vertebral compression fracture without obvious trauma.  This is considered as fragility fracture.  She had DEXA scan in January 2025 consistent with T-score osteopenia with high FRAX score.  Although T-score on DEXA scan is in the range of osteopenia with fragility fractures she will be considered as having clinical osteoporosis. -Other than postmenopausal status, no obvious risk factors for osteoporosis.  She had couple of glucocorticoid injection into the joint in the past. -She has been on tamoxifen with having history of breast cancer since April 2023.  Tamoxifen in postmenopausal female is bone protective.  Plan: - I would like to complete secondary workup for osteoporosis, will check 24 urine calcium , protein electrophoresis. - She had normal parathyroid hormone, and vitamin D  level in the past. - Renal function mildly low EGFR in the range of 50-60's.  I will check renal function panel today. - Check thyroid  function test.  She had mildly low TSH in August 2024.  She is currently taking levothyroxine  100 mcg daily for primary hypothyroidism. - Will consider anabolic agent likely Evenity for the treatment of osteoporosis after these test results.  Followed by Reclast versus  Prolia. - Continue current intake of calcium /vitamin D  1 tablet and vitamin D3 2000 international unit daily. - Discussed about weightbearing exercise and fall precautions.  Diagnoses and all orders for this visit:  Postmenopausal osteoporosis -     Renal function panel -     Serum protein electrophoresis with reflex -     Calcium , 24-Hour Urine with Creatinine  Primary hypothyroidism -     T4, free -     TSH  Age-related osteoporosis with current pathological fracture, initial encounter  Compression fracture of T7 vertebra, initial encounter (HCC)    DISPOSITION Follow up in clinic in 4 weeks suggested.  All questions answered and patient verbalized understanding of the plan.  Iraq Saber Dickerman, MD [facility] Endocrinology [facility] [address] [place], [address] [place], [place] [address] Phone # [phone]  At least part of this note was generated using voice recognition software. Inadvertent word errors may have occurred, which were not recognized during the proofreading process.

## 2023-12-11 NOTE — Patient Instructions (Signed)

## 2023-12-12 ENCOUNTER — Encounter: Payer: Self-pay | Admitting: Endocrinology

## 2023-12-13 ENCOUNTER — Ambulatory Visit: Payer: Self-pay | Admitting: Endocrinology

## 2023-12-14 LAB — CYTOLOGY - PAP: Diagnosis: NEGATIVE

## 2023-12-19 ENCOUNTER — Ambulatory Visit: Payer: Self-pay | Admitting: Obstetrics and Gynecology

## 2023-12-19 LAB — RENAL FUNCTION PANEL
Albumin: 4.2 g/dL (ref 3.6–5.1)
BUN: 20 mg/dL (ref 7–25)
CO2: 27 mmol/L (ref 20–32)
Calcium: 9.4 mg/dL (ref 8.6–10.4)
Chloride: 101 mmol/L (ref 98–110)
Creat: 0.91 mg/dL (ref 0.60–1.00)
Glucose, Bld: 108 mg/dL — ABNORMAL HIGH (ref 65–99)
Phosphorus: 4 mg/dL (ref 2.1–4.3)
Potassium: 4.5 mmol/L (ref 3.5–5.3)
Sodium: 137 mmol/L (ref 135–146)

## 2023-12-19 LAB — PROTEIN ELECTROPHORESIS, SERUM, WITH REFLEX
Albumin ELP: 4 g/dL (ref 3.8–4.8)
Alpha 1: 0.4 g/dL — ABNORMAL HIGH (ref 0.2–0.3)
Alpha 2: 0.7 g/dL (ref 0.5–0.9)
Beta 2: 0.4 g/dL (ref 0.2–0.5)
Beta Globulin: 0.4 g/dL (ref 0.4–0.6)
Gamma Globulin: 0.7 g/dL — ABNORMAL LOW (ref 0.8–1.7)
Total Protein: 6.6 g/dL (ref 6.1–8.1)

## 2023-12-19 LAB — IFE INTERPRETATION

## 2023-12-19 LAB — TSH: TSH: 0.94 m[IU]/L (ref 0.40–4.50)

## 2023-12-19 LAB — T4, FREE: Free T4: 1.4 ng/dL (ref 0.8–1.8)

## 2024-01-14 ENCOUNTER — Ambulatory Visit: Admitting: Endocrinology

## 2024-01-16 ENCOUNTER — Other Ambulatory Visit

## 2024-01-16 DIAGNOSIS — E039 Hypothyroidism, unspecified: Secondary | ICD-10-CM | POA: Diagnosis not present

## 2024-01-16 DIAGNOSIS — M810 Age-related osteoporosis without current pathological fracture: Secondary | ICD-10-CM | POA: Diagnosis not present

## 2024-01-17 LAB — CALCIUM, 24-HOUR URINE WITH CREATININE
CALCIUM/CREATININE RATIO: 61 mg/g{creat} (ref 30–275)
Calcium, 24H Urine: 59 mg/(24.h)
Creatinine, 24H Ur: 0.97 g/(24.h) (ref 0.50–2.15)

## 2024-01-18 DIAGNOSIS — C50412 Malignant neoplasm of upper-outer quadrant of left female breast: Secondary | ICD-10-CM | POA: Diagnosis not present

## 2024-02-21 NOTE — Progress Notes (Signed)
 [facility] [address] [place] [address] Phone: [phone] Subjective:   LILLETTE Berwyn Posey, am serving as a scribe for Dr. Arthea Claudene.  I'm seeing this patient by the request  of:  Johnny Garnette LABOR, MD  CC: Right knee pain, back pain  YEP:Dlagzrupcz  11/26/2023 Mild exacerbation.  Does have known arthritic changes.  Discussed potential different treatment options including multiple different types of injections.  Discussed icing regimen and home exercises, discussed avoiding certain activities.  Discussed potential sleeping positions that I think will be beneficial.  Follow-up again 2 to 3 months to further evaluate.     Completely healed at this time.  No change in management.  Patient is able to increase activity as tolerated.     Updated 02/26/2024 ANNAJULIA LEWING is a 73 y.o. female coming in with complaint of R knee pain. Patient states that she has been able to vacuum without pain. She has a slight discomfort near the compression fx from time to time.   Knee is doing better but standing up for prolonged periods increases her pain.   Has not been exercising.        Past Medical History:  Diagnosis Date   Allergy    Arthritis    Cancer (HCC) 05/2021   Breast   Cataract    Compression fracture of spine (HCC)    COPD (chronic obstructive pulmonary disease) (HCC)    Hyperlipidemia    Osteopenia 07/2018   T score -1.3 FRAX 9% / 1.1%   Osteoporosis    Personal history of radiation therapy    Thyroid  disease    Past Surgical History:  Procedure Laterality Date   BREAST BIOPSY Left 05/2021   BREAST LUMPECTOMY Left 08/16/2021   with lymph node dissection   BROKEN LEG     COLONOSCOPY  05/18/2022   per Dr. Aneita, adenomatous polyps, repeat in 7 yrs   NOSE SURGERY     Social History   Socioeconomic History   Marital status: Married    Spouse name: Not on file   Number of children: Not on file   Years of education: Not on  file   Highest education level: Not on file  Occupational History   Not on file  Tobacco Use   Smoking status: Former    Current packs/day: 0.00    Types: Cigarettes    Quit date: 07/07/2014    Years since quitting: 9.6   Smokeless tobacco: Never  Vaping Use   Vaping status: Never Used  Substance and Sexual Activity   Alcohol use: Yes    Alcohol/week: 0.0 standard drinks of alcohol    Comment: rare   Drug use: No   Sexual activity: Not Currently    Birth control/protection: Post-menopausal    Comment: 1st intercourse 73 yo-Fewer than 5 partners  Other Topics Concern   Not on file  Social History Narrative   Not on file   Social Drivers of Health   Financial Resource Strain: Not on file  Food Insecurity: No Food Insecurity (03/14/2022)   Hunger Vital Sign    Worried About Running Out of Food in the Last Year: Never true    Ran Out of Food in the Last Year: Never true  Transportation Needs: No Transportation Needs (03/14/2022)   PRAPARE - Transportation    Lack of Transportation (Medical): No    Lack of Transportation (Non-Medical): No  Physical Activity: Insufficiently Active (02/23/2023)   Exercise Vital Sign  Days of Exercise per Week: 1 day    Minutes of Exercise per Session: 60 min  Stress: No Stress Concern Present (02/23/2023)   [employer] of Occupational Health - Occupational Stress Questionnaire    Feeling of Stress : Not at all  Social Connections: Moderately Integrated (02/23/2023)   Social Connection and Isolation Panel    Frequency of Communication with Friends and Family: More than three times a week    Frequency of Social Gatherings with Friends and Family: Twice a week    Attends Religious Services: Never    Database administrator or Organizations: Yes    Attends Engineer, structural: More than 4 times per year    Marital Status: Married   Allergies  Allergen Reactions   Chlorhexidine Gluconate     Swelling of tongue ( mouth rinse )    Clindamycin/Lincomycin Diarrhea   Red Dye #40 (Allura Red) Diarrhea and Nausea And Vomiting   Family History  Problem Relation Age of Onset   Arthritis Mother    Hypertension Mother    Arthritis Brother    Heart disease Brother    Stomach cancer Maternal Grandmother 60   Colon cancer Maternal Grandfather 48   Heart disease Paternal Grandmother    Colon cancer Paternal Grandfather 52   Breast cancer Neg Hx    Colon polyps Neg Hx    Esophageal cancer Neg Hx    Rectal cancer Neg Hx     Current Outpatient Medications (Endocrine & Metabolic):    calcitonin, salmon, (MIACALCIN/FORTICAL) 200 UNIT/ACT nasal spray, Place 2 sprays into alternate nostrils daily.   levothyroxine  (SYNTHROID ) 100 MCG tablet, Take 1 tablet (100 mcg total) by mouth daily.  Current Outpatient Medications (Cardiovascular):    rosuvastatin  (CRESTOR ) 10 MG tablet, Take 1 tablet by mouth  daily  Current Outpatient Medications (Respiratory):    triamcinolone  (NASACORT  AQ) 55 MCG/ACT AERO nasal inhaler, Place 2 sprays into the nose daily.  Current Outpatient Medications (Analgesics):    acetaminophen (TYLENOL) 325 MG tablet, Take by mouth.   Diclofenac  Sodium (VOLTAREN  PO), Take by mouth.   Current Outpatient Medications (Other):    Calcium  Carbonate-Vit D-Min (CALTRATE 600+D PLUS PO), Take by mouth.   Calcium  Citrate-Vitamin D  (CITRACAL + D PO), Take 1 tablet by mouth daily.   gabapentin  (NEURONTIN ) 100 MG capsule, TAKE 2 CAPSULES BY MOUTH TWICE  DAILY   tamoxifen (NOLVADEX) 10 MG tablet, Take 20 mg by mouth daily.   triamcinolone  cream (KENALOG ) 0.1 %, Apply 1 Application topically 2 (two) times daily.   VITAMIN D  PO, Take by mouth. Taking with calcium  and vitamin D  combination tab.   Vitamin D , Ergocalciferol , (DRISDOL ) 1.25 MG (50000 UNIT) CAPS capsule, Take 1 capsule (50,000 Units total) by mouth every 7 (seven) days.   Reviewed prior external information including notes and imaging from  primary care  provider As well as notes that were available from care everywhere and other healthcare systems.  Past medical history, social, surgical and family history all reviewed in electronic medical record.  No pertanent information unless stated regarding to the chief complaint.   Review of Systems:  No headache, visual changes, nausea, vomiting, diarrhea, constipation, dizziness, abdominal pain, skin rash, fevers, chills, night sweats, weight loss, swollen lymph nodes, body aches, joint swelling, chest pain, shortness of breath, mood changes. POSITIVE muscle aches  Objective  Blood pressure 138/62, pulse 81, height 5' 7 (1.702 m), weight 144 lb (65.3 kg), SpO2 98%.   General: No apparent  distress alert and oriented x3 mood and affect normal, dressed appropriately.  HEENT: Pupils equal, extraocular movements intact  Respiratory: Patient's speak in full sentences and does not appear short of breath  Cardiovascular: No lower extremity edema, non tender, no erythema      Impression and Recommendations:    The above documentation has been reviewed and is accurate and complete Watson Robarge M Natia Fahmy, DO

## 2024-02-26 ENCOUNTER — Ambulatory Visit (INDEPENDENT_AMBULATORY_CARE_PROVIDER_SITE_OTHER): Admitting: Family Medicine

## 2024-02-26 ENCOUNTER — Other Ambulatory Visit: Payer: Self-pay

## 2024-02-26 VITALS — BP 138/62 | HR 81 | Ht 67.0 in | Wt 144.0 lb

## 2024-02-26 DIAGNOSIS — M222X1 Patellofemoral disorders, right knee: Secondary | ICD-10-CM

## 2024-02-26 DIAGNOSIS — M25561 Pain in right knee: Secondary | ICD-10-CM | POA: Diagnosis not present

## 2024-02-26 DIAGNOSIS — S22000A Wedge compression fracture of unspecified thoracic vertebra, initial encounter for closed fracture: Secondary | ICD-10-CM | POA: Diagnosis not present

## 2024-02-26 NOTE — Assessment & Plan Note (Signed)
 Still has some.  Discussed with patient icing regimen and home exercises, discussed which activities to do in which ones to avoid.  Increase activity slowly.  A Tru pull lite given.  Increase activity.  Follow-up again.  3 months.

## 2024-02-26 NOTE — Patient Instructions (Addendum)
 You have 14 days to return or exchange your brace Call [phone], then return the brace to our office Good to see you! See you again in 3 months

## 2024-02-26 NOTE — Assessment & Plan Note (Signed)
 Much better overall at this time.  Diet changes.  Follow-up with endocrinology and discussing the possibility of Evenity

## 2024-03-10 ENCOUNTER — Ambulatory Visit: Admitting: Endocrinology

## 2024-03-11 ENCOUNTER — Other Ambulatory Visit: Payer: Self-pay | Admitting: Family Medicine

## 2024-03-18 ENCOUNTER — Ambulatory Visit: Admitting: Family Medicine

## 2024-03-18 ENCOUNTER — Encounter: Payer: Self-pay | Admitting: Family Medicine

## 2024-03-18 ENCOUNTER — Ambulatory Visit: Payer: Self-pay | Admitting: Family Medicine

## 2024-03-18 VITALS — BP 120/62 | HR 80 | Temp 98.1°F | Ht 66.5 in | Wt 143.0 lb

## 2024-03-18 DIAGNOSIS — E782 Mixed hyperlipidemia: Secondary | ICD-10-CM

## 2024-03-18 DIAGNOSIS — S22000A Wedge compression fracture of unspecified thoracic vertebra, initial encounter for closed fracture: Secondary | ICD-10-CM | POA: Diagnosis not present

## 2024-03-18 DIAGNOSIS — E039 Hypothyroidism, unspecified: Secondary | ICD-10-CM

## 2024-03-18 DIAGNOSIS — E559 Vitamin D deficiency, unspecified: Secondary | ICD-10-CM | POA: Diagnosis not present

## 2024-03-18 DIAGNOSIS — R739 Hyperglycemia, unspecified: Secondary | ICD-10-CM | POA: Diagnosis not present

## 2024-03-18 DIAGNOSIS — M150 Primary generalized (osteo)arthritis: Secondary | ICD-10-CM | POA: Diagnosis not present

## 2024-03-18 LAB — CBC WITH DIFFERENTIAL/PLATELET
Basophils Absolute: 0 K/uL (ref 0.0–0.1)
Basophils Relative: 0.5 % (ref 0.0–3.0)
Eosinophils Absolute: 0.1 K/uL (ref 0.0–0.7)
Eosinophils Relative: 1.3 % (ref 0.0–5.0)
HCT: 37.1 % (ref 36.0–46.0)
Hemoglobin: 12.4 g/dL (ref 12.0–15.0)
Lymphocytes Relative: 13.3 % (ref 12.0–46.0)
Lymphs Abs: 1 K/uL (ref 0.7–4.0)
MCHC: 33.4 g/dL (ref 30.0–36.0)
MCV: 91.6 fl (ref 78.0–100.0)
Monocytes Absolute: 0.5 K/uL (ref 0.1–1.0)
Monocytes Relative: 6.8 % (ref 3.0–12.0)
Neutro Abs: 5.7 K/uL (ref 1.4–7.7)
Neutrophils Relative %: 78.1 % — ABNORMAL HIGH (ref 43.0–77.0)
Platelets: 358 K/uL (ref 150.0–400.0)
RBC: 4.05 Mil/uL (ref 3.87–5.11)
RDW: 13.3 % (ref 11.5–15.5)
WBC: 7.3 K/uL (ref 4.0–10.5)

## 2024-03-18 LAB — LIPID PANEL
Cholesterol: 240 mg/dL — ABNORMAL HIGH (ref 0–200)
HDL: 73.6 mg/dL (ref >39.00)
LDL Cholesterol: 139 mg/dL — ABNORMAL HIGH (ref 0–99)
NonHDL: 166.25
Total CHOL/HDL Ratio: 3
Triglycerides: 135 mg/dL (ref 0.0–149.0)
VLDL: 27 mg/dL (ref 0.0–40.0)

## 2024-03-18 LAB — BASIC METABOLIC PANEL WITH GFR
BUN: 20 mg/dL (ref 6–23)
CO2: 27 meq/L (ref 19–32)
Calcium: 8.9 mg/dL (ref 8.4–10.5)
Chloride: 99 meq/L (ref 96–112)
Creatinine, Ser: 0.87 mg/dL (ref 0.40–1.20)
GFR: 65.28 mL/min (ref >60.00)
Glucose, Bld: 87 mg/dL (ref 70–99)
Potassium: 4.4 meq/L (ref 3.5–5.1)
Sodium: 135 meq/L (ref 135–145)

## 2024-03-18 LAB — VITAMIN D 25 HYDROXY (VIT D DEFICIENCY, FRACTURES): VITD: 45.42 ng/mL (ref 30.00–100.00)

## 2024-03-18 LAB — HEPATIC FUNCTION PANEL
ALT: 12 U/L (ref 0–35)
AST: 17 U/L (ref 0–37)
Albumin: 4.1 g/dL (ref 3.5–5.2)
Alkaline Phosphatase: 60 U/L (ref 39–117)
Bilirubin, Direct: 0.1 mg/dL (ref 0.0–0.3)
Total Bilirubin: 0.4 mg/dL (ref 0.2–1.2)
Total Protein: 6.8 g/dL (ref 6.0–8.3)

## 2024-03-18 LAB — TSH: TSH: 2.45 u[IU]/mL (ref 0.35–5.50)

## 2024-03-18 LAB — T4, FREE: Free T4: 1.37 ng/dL (ref 0.60–1.60)

## 2024-03-18 LAB — T3, FREE: T3, Free: 2.6 pg/mL (ref 2.3–4.2)

## 2024-03-18 LAB — HEMOGLOBIN A1C: Hgb A1c MFr Bld: 5.8 % (ref 4.6–6.5)

## 2024-03-18 MED ORDER — MELOXICAM 15 MG PO TABS
15.0000 mg | ORAL_TABLET | Freq: Every day | ORAL | 3 refills | Status: AC
Start: 2024-03-18 — End: ?

## 2024-03-18 MED ORDER — LEVOTHYROXINE SODIUM 100 MCG PO TABS: 100.0000 ug | ORAL_TABLET | Freq: Every day | ORAL | 3 refills | Status: AC

## 2024-03-18 NOTE — Progress Notes (Signed)
 Subjective:    Patient ID: Stacy Gillespie, female    DOB: [birth year], 73 y.o.   MRN: [id number]  HPI Here to follow up on issues. Her OA is stable but she asks if she could take an NSAID that is safer to take then Diclofenac . She was diagnosed with a vertebral compression fracture at T7 on 07-24-23, and she has been seeing Dr. Sharps for this. A DEXA scan on 08-21-23 revealed osteopenia, so Dr. Sharps has raised the possibility of starting an agent like Prolia to build up her bones. She sees Oncology for her hx of breast cancer.    Review of Systems  Constitutional: Negative.   HENT: Negative.    Eyes: Negative.   Respiratory: Negative.    Cardiovascular: Negative.   Gastrointestinal: Negative.   Genitourinary:  Negative for decreased urine volume, difficulty urinating, dyspareunia, dysuria, enuresis, flank pain, frequency, hematuria, pelvic pain and urgency.  Musculoskeletal:  Positive for arthralgias and back pain.  Skin: Negative.   Neurological: Negative.  Negative for headaches.  Psychiatric/Behavioral: Negative.         Objective:   Physical Exam Constitutional:      General: She is not in acute distress.    Appearance: Normal appearance. She is well-developed.  HENT:     Head: Normocephalic and atraumatic.     Right Ear: External ear normal.     Left Ear: External ear normal.     Nose: Nose normal.     Mouth/Throat:     Pharynx: No oropharyngeal exudate.  Eyes:     General: No scleral icterus.    Conjunctiva/sclera: Conjunctivae normal.     Pupils: Pupils are equal, round, and reactive to light.  Neck:     Thyroid : No thyromegaly.     Vascular: No JVD.  Cardiovascular:     Rate and Rhythm: Normal rate and regular rhythm.     Pulses: Normal pulses.     Heart sounds: Normal heart sounds. No murmur heard.    No friction rub. No gallop.  Pulmonary:     Effort: Pulmonary effort is normal. No respiratory distress.     Breath sounds: Normal breath sounds. No  wheezing or rales.  Chest:     Chest wall: No tenderness.  Abdominal:     General: Bowel sounds are normal. There is no distension.     Palpations: Abdomen is soft. There is no mass.     Tenderness: There is no abdominal tenderness. There is no guarding or rebound.  Musculoskeletal:        General: No tenderness. Normal range of motion.     Cervical back: Normal range of motion and neck supple.  Lymphadenopathy:     Cervical: No cervical adenopathy.  Skin:    General: Skin is warm and dry.     Findings: No erythema or rash.  Neurological:     General: No focal deficit present.     Mental Status: She is alert and oriented to person, place, and time.     Cranial Nerves: No cranial nerve deficit.     Motor: No abnormal muscle tone.     Coordination: Coordination normal.     Deep Tendon Reflexes: Reflexes are normal and symmetric. Reflexes normal.  Psychiatric:        Mood and Affect: Mood normal.        Behavior: Behavior normal.        Thought Content: Thought content normal.  Judgment: Judgment normal.           Assessment & Plan:  Her OA is stable but we will switch from Diclofenac  to Meloxicam  15 mg daily. She will follow up with Dr. Claudene for the vertebral compression fracture and the osteopenia. We will get labs to check her thyroid  levels, lipids, vitamin D  level, etc.  Garnette Olmsted, MD

## 2024-04-16 DIAGNOSIS — H04221 Epiphora due to insufficient drainage, right lacrimal gland: Secondary | ICD-10-CM | POA: Diagnosis not present

## 2024-04-30 DIAGNOSIS — H04221 Epiphora due to insufficient drainage, right lacrimal gland: Secondary | ICD-10-CM | POA: Diagnosis not present

## 2024-05-01 ENCOUNTER — Encounter: Payer: Self-pay | Admitting: Endocrinology

## 2024-05-01 ENCOUNTER — Ambulatory Visit (INDEPENDENT_AMBULATORY_CARE_PROVIDER_SITE_OTHER): Admitting: Endocrinology

## 2024-05-01 VITALS — BP 118/60 | HR 76 | Resp 20 | Ht 66.5 in | Wt 146.0 lb

## 2024-05-01 DIAGNOSIS — M8000XD Age-related osteoporosis with current pathological fracture, unspecified site, subsequent encounter for fracture with routine healing: Secondary | ICD-10-CM | POA: Diagnosis not present

## 2024-05-01 DIAGNOSIS — S22060A Wedge compression fracture of T7-T8 vertebra, initial encounter for closed fracture: Secondary | ICD-10-CM

## 2024-05-01 DIAGNOSIS — M810 Age-related osteoporosis without current pathological fracture: Secondary | ICD-10-CM

## 2024-05-01 NOTE — Progress Notes (Unsigned)
 Outpatient Endocrinology Note [place] Akshith Moncus, MD   Patient's Name: Stacy Gillespie    DOB: 01-Feb-1951    MRN: [id number]  REASON OF VISIT: Follow up for osteoporosis  REFERRING PROVIDER: Johnny Garnette LABOR, MD  PCP: Johnny Garnette LABOR, MD  HISTORY OF PRESENT ILLNESS:   Stacy Gillespie is a 73 y.o. old female with past medical history listed below, is here for follow-up of bone health issues / osteoporosis.  Pertinent Bone Health History: Patient was referred for evaluation and management of bone health/osteoporosis.  Initial consult on Dec 11, 2023.  Patient had T7 compression fracture in November/December 2024 timeframe, MRI on July 24, 2023 consistent with T7 acute endplate compression fracture resulting approximately 30% loss of vertebral body height.  No T7 compression fracture was not related to the significant mechanical trauma.  It happened by pulling her husband from the floor in lean forward position.  She had DEXA scan on August 21, 2023 lowest T-score in the range of osteopenia right femoral neck T-score -1.8, FRAX score: 10 year major osteoporotic risk: 18.1%. 10 year hip fracture risk: 4.0%   Date of study: 08/21/2023, reviewed images. Exam: DUAL X-RAY ABSORPTIOMETRY (DXA) FOR BONE MINERAL DENSITY (BMD) Instrument: [employer] Requesting Provider: Dr. Sharps Indication: Screening for osteoporosis Comparison: none (please note that it is not possible to compare data from different instruments) Clinical data: Pt is a 73 y.o. female with history of thoracic vertebral compression fracture. On calcium  and vitamin D .  Also, on tamoxifen and nasal calcitonin.   Results:   Lumbar spine L1-L4 Femoral neck (FN) 33% distal radius  T-score 0.0 RFN: -1.8 LFN: -1.7 n/a  Assessment: the BMD is low according to the [facility] classification for osteoporosis (see below).  Due to the history of compression fracture, patient has clinical osteoporosis. Fracture risk: HIGH FRAX score: 10  year major osteoporotic risk: 18.1%. 10 year hip fracture risk: 4.0%. The thresholds for treatment are 20% and 3%, respectively. Comments: the technical quality of the study is good. Evaluation for secondary causes should be considered if clinically indicated.  Recommend optimizing calcium  (1200 mg/day) and vitamin D  (800 IU/day) intake.   # Patient is known to have osteopenia with DEXA scan in May 2023 consistent with osteopenia with lowest T-score of -1.2 at lumbar spine L1-L2 and left femoral neck -1.9.  She had DEXA scan in 2020, 2017, 2015 with a T-score in the range of osteopenia.   Bone Health Concerns:  No smoking, no excessive alcohol intake.  She had glucocorticoid injection into knee 2 or 3 years ago.  No  malabsorption syndrome or liver disorder.   Fracture history - T7 compression fracture with no obvious trauma in December 2024. - Patient reports history of ankle fracture in the past.  Osteoporosis medications: None  Secondary workup for osteoporosis: She had normal parathyroid hormone, vitamin D  level in the past.  Renal function with mildly low EGFR in 50-60's range.  Workup completed in May /June 2025 with normal protein electrophoresis, normal thyroid  function test, normal 24 urine calcium  of 61.  Calcium  intake from supplements: calcium  / Vit 1 tab occasionally.  Dietary calcium  intake: 1 cup milk, cheese.  Current vitamin D  intake: 2000 international units daily.  Relevant comorbidities: No history of bone cancer. No history of external radiation treatment to bone.   No history of diabetes mellitus.  No Rheumatoid arthritis. No history of GERD.  No history of recent invasive dental procedures. Not planning dental procedures in the  near future. No history of coronary artery disease, heart failure or stroke.  She has history of left breast cancer status post lumpectomy and radiation therapy in March - April 2023, on tamoxifen.  # Primary hypothyroidism : She is  currently taking levothyroxine  100 mcg daily.  Managed by primary care provider.  Interval history: Patient presented for follow-up.  She had a secondary laboratory workup in the initial visit, results were in acceptable range as noted above.  She has not been on pharmacological treatment for bone health.  She has no fall and fracture.  She denies any back pain.  She has complaints of knee pain and has been following with sports medicine.  She has been taking calcium , vitamin D  and multivitamin supplement as mentioned above.  Patient reports she has hard time understanding my communication at times, with her request nurse RN was present during the clinic visit today.  REVIEW OF SYSTEMS:  As per history of present illness.   PAST MEDICAL HISTORY: Past Medical History:  Diagnosis Date   Allergy    Arthritis    Cancer (HCC) 05/2021   Breast   Cataract    Compression fracture of spine (HCC)    COPD (chronic obstructive pulmonary disease) (HCC)    Hyperlipidemia    Osteopenia 07/2018   T score -1.3 FRAX 9% / 1.1%   Osteoporosis    Personal history of radiation therapy    Thyroid  disease     PAST SURGICAL HISTORY: Past Surgical History:  Procedure Laterality Date   BREAST BIOPSY Left 05/2021   BREAST LUMPECTOMY Left 08/16/2021   with lymph node dissection   BROKEN LEG     COLONOSCOPY  05/18/2022   per Dr. Aneita, adenomatous polyps, repeat in 7 yrs   NOSE SURGERY      ALLERGIES: Allergies  Allergen Reactions   Chlorhexidine Gluconate     Swelling of tongue ( mouth rinse )   Clindamycin/Lincomycin Diarrhea   Red Dye #40 (Allura Red) Diarrhea and Nausea And Vomiting    FAMILY HISTORY:  Family History  Problem Relation Age of Onset   Arthritis Mother    Hypertension Mother    Arthritis Brother    Heart disease Brother    Stomach cancer Maternal Grandmother 60   Colon cancer Maternal Grandfather 75   Heart disease Paternal Grandmother    Colon cancer Paternal  Grandfather 98   Breast cancer Neg Hx    Colon polyps Neg Hx    Esophageal cancer Neg Hx    Rectal cancer Neg Hx     SOCIAL HISTORY: Social History   Socioeconomic History   Marital status: Married    Spouse name: Not on file   Number of children: Not on file   Years of education: Not on file   Highest education level: Not on file  Occupational History   Not on file  Tobacco Use   Smoking status: Former    Current packs/day: 0.00    Types: Cigarettes    Quit date: 07/07/2014    Years since quitting: 9.8   Smokeless tobacco: Never  Vaping Use   Vaping status: Never Used  Substance and Sexual Activity   Alcohol use: Yes    Alcohol/week: 0.0 standard drinks of alcohol    Comment: rare   Drug use: No   Sexual activity: Not Currently    Birth control/protection: Post-menopausal    Comment: 1st intercourse 73 yo-Fewer than 5 partners  Other Topics Concern   Not on  file  Social History Narrative   Not on file   Social Drivers of Health   Financial Resource Strain: Not on file  Food Insecurity: No Food Insecurity (03/14/2022)   Hunger Vital Sign    Worried About Running Out of Food in the Last Year: Never true    Ran Out of Food in the Last Year: Never true  Transportation Needs: No Transportation Needs (03/14/2022)   PRAPARE - Administrator, Civil Service (Medical): No    Lack of Transportation (Non-Medical): No  Physical Activity: Insufficiently Active (02/23/2023)   Exercise Vital Sign    Days of Exercise per Week: 1 day    Minutes of Exercise per Session: 60 min  Stress: No Stress Concern Present (02/23/2023)   [employer] of Occupational Health - Occupational Stress Questionnaire    Feeling of Stress : Not at all  Social Connections: Moderately Integrated (02/23/2023)   Social Connection and Isolation Panel    Frequency of Communication with Friends and Family: More than three times a week    Frequency of Social Gatherings with Friends and Family:  Twice a week    Attends Religious Services: Never    Database administrator or Organizations: Yes    Attends Engineer, structural: More than 4 times per year    Marital Status: Married    MEDICATIONS:  Current Outpatient Medications  Medication Sig Dispense Refill   acetaminophen (TYLENOL) 325 MG tablet Take by mouth.     calcitonin, salmon, (MIACALCIN/FORTICAL) 200 UNIT/ACT nasal spray Place 2 sprays into alternate nostrils daily. 5.4 mL 0   Calcium  Carbonate-Vit D-Min (CALTRATE 600+D PLUS PO) Take by mouth.     Calcium  Citrate-Vitamin D  (CITRACAL + D PO) Take 1 tablet by mouth daily.     gabapentin  (NEURONTIN ) 100 MG capsule TAKE 2 CAPSULES BY MOUTH TWICE  DAILY 360 capsule 3   levothyroxine  (SYNTHROID ) 100 MCG tablet Take 1 tablet (100 mcg total) by mouth daily. 90 tablet 3   meloxicam  (MOBIC ) 15 MG tablet Take 1 tablet (15 mg total) by mouth daily. 90 tablet 3   rosuvastatin  (CRESTOR ) 10 MG tablet Take 1 tablet by mouth  daily 90 tablet 3   tamoxifen (NOLVADEX) 10 MG tablet Take 20 mg by mouth daily.     triamcinolone  (NASACORT  AQ) 55 MCG/ACT AERO nasal inhaler Place 2 sprays into the nose daily. 3 Inhaler 3   triamcinolone  cream (KENALOG ) 0.1 % Apply 1 Application topically 2 (two) times daily. 45 g 5   VITAMIN D  PO Take by mouth. Taking with calcium  and vitamin D  combination tab.     Vitamin D , Ergocalciferol , (DRISDOL ) 1.25 MG (50000 UNIT) CAPS capsule Take 1 capsule (50,000 Units total) by mouth every 7 (seven) days. 12 capsule 0   No current facility-administered medications for this visit.    PHYSICAL EXAM: Vitals:   05/01/24 1540  BP: 118/60  Pulse: 76  Resp: 20  SpO2: 97%  Weight: 146 lb (66.2 kg)  Height: 5' 6.5 (1.689 m)    Body mass index is 23.21 kg/m.  Wt Readings from Last 3 Encounters:  05/01/24 146 lb (66.2 kg)  03/18/24 143 lb (64.9 kg)  02/26/24 144 lb (65.3 kg)    General: Well developed, well nourished female in no apparent distress.   HEENT: AT/[place], no external lesions. Hearing intact to the spoken word Eyes: EOMI. Conjunctiva clear and no icterus. Neck: Trachea midline, neck supple Abdomen: Soft, non tender Neurologic: Alert, oriented,  normal speech, deep tendon biceps reflexes normal,  no gross focal neurological deficit Extremities: No pedal pitting edema, no tremors of outstretched hands. No spine tenderness Skin: Warm, color good.  Psychiatric: Does not appear depressed or anxious  PERTINENT HISTORIC LABORATORY AND IMAGING STUDIES:  All pertinent laboratory results were reviewed. Please see HPI also for further details.   Lab Results  Component Value Date   ALKPHOS 60 03/18/2024   ALKPHOS 72 07/23/2023   ALKPHOS 69 02/23/2023     ASSESSMENT / PLAN  1. Age-related osteoporosis with current pathological fracture with routine healing, subsequent encounter   2. Compression fracture of T7 vertebra, initial encounter (HCC)   3. Postmenopausal osteoporosis     Stacy Gillespie is a 73 y.o. old female with osteoporosis based on history of fragility fractures.  Patient had T7 vertebral compression fracture without obvious trauma.  This is considered as fragility fracture.  She had DEXA scan in January 2025 consistent with T-score osteopenia with high FRAX score.  Although T-score on DEXA scan is in the range of osteopenia with fragility fractures she will be considered as having clinical osteoporosis. - Risk factors for osteoporosis postmenopausal status and use of glucocorticoid injection in the past.   -She has been on tamoxifen with having history of breast cancer since April 2023.  Tamoxifen in postmenopausal female is bone protective. - Secondary workup for osteoporosis negative as mentioned in HPI.  Plan: -Discussed about option of pharmacological treatment for osteoporosis.  Due to vertebral fragility fracture I recommended for anabolic treatment followed by antiresorptive therapy. -Discussed options of Evenity  monthly for 12 months followed by Prolia or Reclast vs start antiresorptive therapy from the beginning with Reclast or Prolia.  She has CKD II-IIIa, probably prefer Prolia as antiresorptive therapy. -Patient wants to review medication side effect profile and other factors before making decision.  She also wants to discuss with sport medicine Dr. Claudene regarding treatment option, has follow-up in the near future for her knee pain.  She reports she has planned for cataract surgery in about 2 months and she wants to start bone health treatment after that. - Discussed potential side effects of these medications including allergic reaction, osteonecrosis of the jaw, atypical lung or fracture.  Discussed potential side effect of Reclast causing flulike symptoms after IV infusion.  She is concerned about side effect of these medications.  We discussed that it is a risk versus benefit and I recommended she will have more benefit taking these medications.  I provided written instruction about Prolia, Reclast and Evenity. - Patient is not decided for the medication option today.  Asked patient to call our clinic once she decides, we will start process for the medication. - Continue current intake of calcium /vitamin D  1 tablet and vitamin D3 2000 international unit daily. Vitamin D  was normal in August 45. - Discussed about fall precautions and weightbearing exercise as tolerated.  Patient reports she has at times hard time understanding my communication, I recommended to follow-up with other endocrinology provider in our clinic.  Diagnoses and all orders for this visit:  Age-related osteoporosis with current pathological fracture with routine healing, subsequent encounter  Compression fracture of T7 vertebra, initial encounter (HCC)  Postmenopausal osteoporosis   DISPOSITION Follow up in clinic in 6 months suggested.  All questions answered and patient verbalized understanding of the plan.  [place] Kamri Gotsch,  MD [facility] Endocrinology [facility] [address] [place], [address] [place], [place] [address] Phone # [phone]  At  least part of this note was generated using voice recognition software. Inadvertent word errors may have occurred, which were not recognized during the proofreading process.

## 2024-05-01 NOTE — Patient Instructions (Signed)
 Zoledronic Acid Injection (Bone Disorders) What is this medication? ZOLEDRONIC ACID (ZOE le dron ik AS id) prevents and treats osteoporosis. It may also be used to treat Paget's disease of the bone. It works by Interior and spatial designer stronger and less likely to break (fracture). It belongs to a group of medications called bisphosphonates. This medicine may be used for other purposes; ask your health care provider or pharmacist if you have questions. COMMON BRAND NAME(S): Reclast What should I tell my care team before I take this medication? They need to know if you have any of these conditions: Bleeding disorder Cancer Dental disease Kidney disease Low levels of calcium  in the blood Low red blood cell counts Lung or breathing disease, such as asthma Receiving steroids, such as dexamethasone  or prednisone An unusual or allergic reaction to zoledronic acid, other medications, foods, dyes, or preservatives Pregnant or trying to get pregnant Breast-feeding How should I use this medication? This medication is injected into a vein. It is given by your care team in a hospital or clinic setting. A special MedGuide will be given to you before each treatment. Be sure to read this information carefully each time. Talk to your care team about the use of this medication in children. Special care may be needed. Overdosage: If you think you have taken too much of this medicine contact a poison control center or emergency room at once. NOTE: This medicine is only for you. Do not share this medicine with others. What if I miss a dose? Keep appointments for follow-up doses. It is important not to miss your dose. Call your care team if you are unable to keep an appointment. What may interact with this medication? Certain antibiotics given by injection Medications for pain and inflammation, such as ibuprofen, naproxen, NSAIDs Some diuretics, such as bumetanide, furosemide Teriparatide This list may not  describe all possible interactions. Give your health care provider a list of all the medicines, herbs, non-prescription drugs, or dietary supplements you use. Also tell them if you smoke, drink alcohol, or use illegal drugs. Some items may interact with your medicine. What should I watch for while using this medication? Visit your care team for regular checks on your progress. It may be some time before you see the benefit from this medication. Some people who take this medication have severe bone, joint, or muscle pain. This medication may also increase your risk for jaw problems or a broken thigh bone. Tell your care team right away if you have severe pain in your jaw, bones, joints, or muscles. Tell your care team if you have any pain that does not go away or that gets worse. You should make sure you get enough calcium  and vitamin D  while you are taking this medication. Discuss the foods you eat and the vitamins you take with your care team. You may need bloodwork while taking this medication. Tell your dentist and dental surgeon that you are taking this medication. You should not have major dental surgery while on this medication. See your dentist to have a dental exam and fix any dental problems before starting this medication. Take good care of your teeth while on this medication. Make sure you see your dentist for regular follow-up appointments. What side effects may I notice from receiving this medication? Side effects that you should report to your care team as soon as possible: Allergic reactions--skin rash, itching, hives, swelling of the face, lips, tongue, or throat Kidney injury--decrease in the amount of urine,  swelling of the ankles, hands, or feet Low calcium  level--muscle pain or cramps, confusion, tingling, or numbness in the hands or feet Osteonecrosis of the jaw--pain, swelling, or redness in the mouth, numbness of the jaw, poor healing after dental work, unusual discharge from the  mouth, visible bones in the mouth Severe bone, joint, or muscle pain Side effects that usually do not require medical attention (report to your care team if they continue or are bothersome): Diarrhea Dizziness Headache Nausea Stomach pain Vomiting This list may not describe all possible side effects. Call your doctor for medical advice about side effects. You may report side effects to FDA at 1-800-FDA-1088. Where should I keep my medication? This medication is given in a hospital or clinic. It will not be stored at home. NOTE: This sheet is a summary. It may not cover all possible information. If you have questions about this medicine, talk to your doctor, pharmacist, or health care provider.  2024 Elsevier/Gold Standard (2021-08-26 00:00:00)   Denosumab Injection (Osteoporosis) What is this medication? DENOSUMAB (den oh SUE mab) prevents and treats osteoporosis. It works by Interior and spatial designer stronger and less likely to break (fracture). It is a monoclonal antibody. This medicine may be used for other purposes; ask your health care provider or pharmacist if you have questions. COMMON BRAND NAME(S): Prolia What should I tell my care team before I take this medication? They need to know if you have any of these conditions: Dental or gum disease Had thyroid  or parathyroid (glands located in neck) surgery Having dental surgery or a tooth pulled Kidney disease Low levels of calcium  in the blood On dialysis Poor nutrition Thyroid  disease Trouble absorbing nutrients from your food An unusual or allergic reaction to denosumab, other medications, foods, dyes, or preservatives Pregnant or trying to get pregnant Breastfeeding How should I use this medication? This medication is injected under the skin. It is given by your care team in a hospital or clinic setting. A special MedGuide will be given to you before each treatment. Be sure to read this information carefully each time. Talk to  your care team about the use of this medication in children. Special care may be needed. Overdosage: If you think you have taken too much of this medicine contact a poison control center or emergency room at once. NOTE: This medicine is only for you. Do not share this medicine with others. What if I miss a dose? Keep appointments for follow-up doses. It is important not to miss your dose. Call your care team if you are unable to keep an appointment. What may interact with this medication? Do not take this medication with any of the following: Other medications that contain denosumab This medication may also interact with the following: Medications that lower your chance of fighting infection Steroid medications, such as prednisone or cortisone This list may not describe all possible interactions. Give your health care provider a list of all the medicines, herbs, non-prescription drugs, or dietary supplements you use. Also tell them if you smoke, drink alcohol, or use illegal drugs. Some items may interact with your medicine. What should I watch for while using this medication? Your condition will be monitored carefully while you are receiving this medication. You may need blood work done while taking this medication. This medication may increase your risk of getting an infection. Call your care team for advice if you get a fever, chills, sore throat, or other symptoms of a cold or flu. Do not treat  yourself. Try to avoid being around people who are sick. Tell your dentist and dental surgeon that you are taking this medication. You should not have major dental surgery while on this medication. See your dentist to have a dental exam and fix any dental problems before starting this medication. Take good care of your teeth while on this medication. Make sure you see your dentist for regular follow-up appointments. This medication may cause low levels of calcium  in your body. The risk of severe side  effects is increased in people with kidney disease. Your care team may prescribe calcium  and vitamin D  to help prevent low calcium  levels while you take this medication. It is important to take calcium  and vitamin D  as directed by your care team. Talk to your care team if you may be pregnant. Serious birth defects may occur if you take this medication during pregnancy and for 5 months after the last dose. You will need a negative pregnancy test before starting this medication. Contraception is recommended while taking this medication and for 5 months after the last dose. Your care team can help you find the option that works for you. Talk to your care team before breastfeeding. Changes to your treatment plan may be needed. What side effects may I notice from receiving this medication? Side effects that you should report to your care team as soon as possible: Allergic reactions--skin rash, itching, hives, swelling of the face, lips, tongue, or throat Infection--fever, chills, cough, sore throat, wounds that don't heal, pain or trouble when passing urine, general feeling of discomfort or being unwell Low calcium  level--muscle pain or cramps, confusion, tingling, or numbness in the hands or feet Osteonecrosis of the jaw--pain, swelling, or redness in the mouth, numbness of the jaw, poor healing after dental work, unusual discharge from the mouth, visible bones in the mouth Severe bone, joint, or muscle pain Skin infection--skin redness, swelling, warmth, or pain Side effects that usually do not require medical attention (report these to your care team if they continue or are bothersome): Back pain Headache Joint pain Muscle pain Pain in the hands, arms, legs, or feet Runny or stuffy nose Sore throat This list may not describe all possible side effects. Call your doctor for medical advice about side effects. You may report side effects to FDA at 1-800-FDA-1088. Where should I keep my  medication? This medication is given in a hospital or clinic. It will not be stored at home. NOTE: This sheet is a summary. It may not cover all possible information. If you have questions about this medicine, talk to your doctor, pharmacist, or health care provider.  2024 Elsevier/Gold Standard (2022-08-15 00:00:00)  Denosumab Injection (Osteoporosis) What is this medication? DENOSUMAB (den oh SUE mab) prevents and treats osteoporosis. It works by Interior and spatial designer stronger and less likely to break (fracture). It is a monoclonal antibody. This medicine may be used for other purposes; ask your health care provider or pharmacist if you have questions. COMMON BRAND NAME(S): Prolia What should I tell my care team before I take this medication? They need to know if you have any of these conditions: Dental or gum disease Had thyroid  or parathyroid (glands located in neck) surgery Having dental surgery or a tooth pulled Kidney disease Low levels of calcium  in the blood On dialysis Poor nutrition Thyroid  disease Trouble absorbing nutrients from your food An unusual or allergic reaction to denosumab, other medications, foods, dyes, or preservatives Pregnant or trying to get pregnant Breastfeeding How  should I use this medication? This medication is injected under the skin. It is given by your care team in a hospital or clinic setting. A special MedGuide will be given to you before each treatment. Be sure to read this information carefully each time. Talk to your care team about the use of this medication in children. Special care may be needed. Overdosage: If you think you have taken too much of this medicine contact a poison control center or emergency room at once. NOTE: This medicine is only for you. Do not share this medicine with others. What if I miss a dose? Keep appointments for follow-up doses. It is important not to miss your dose. Call your care team if you are unable to keep an  appointment. What may interact with this medication? Do not take this medication with any of the following: Other medications that contain denosumab This medication may also interact with the following: Medications that lower your chance of fighting infection Steroid medications, such as prednisone or cortisone This list may not describe all possible interactions. Give your health care provider a list of all the medicines, herbs, non-prescription drugs, or dietary supplements you use. Also tell them if you smoke, drink alcohol, or use illegal drugs. Some items may interact with your medicine. What should I watch for while using this medication? Your condition will be monitored carefully while you are receiving this medication. You may need blood work done while taking this medication. This medication may increase your risk of getting an infection. Call your care team for advice if you get a fever, chills, sore throat, or other symptoms of a cold or flu. Do not treat yourself. Try to avoid being around people who are sick. Tell your dentist and dental surgeon that you are taking this medication. You should not have major dental surgery while on this medication. See your dentist to have a dental exam and fix any dental problems before starting this medication. Take good care of your teeth while on this medication. Make sure you see your dentist for regular follow-up appointments. This medication may cause low levels of calcium  in your body. The risk of severe side effects is increased in people with kidney disease. Your care team may prescribe calcium  and vitamin D  to help prevent low calcium  levels while you take this medication. It is important to take calcium  and vitamin D  as directed by your care team. Talk to your care team if you may be pregnant. Serious birth defects may occur if you take this medication during pregnancy and for 5 months after the last dose. You will need a negative pregnancy test  before starting this medication. Contraception is recommended while taking this medication and for 5 months after the last dose. Your care team can help you find the option that works for you. Talk to your care team before breastfeeding. Changes to your treatment plan may be needed. What side effects may I notice from receiving this medication? Side effects that you should report to your care team as soon as possible: Allergic reactions--skin rash, itching, hives, swelling of the face, lips, tongue, or throat Infection--fever, chills, cough, sore throat, wounds that don't heal, pain or trouble when passing urine, general feeling of discomfort or being unwell Low calcium  level--muscle pain or cramps, confusion, tingling, or numbness in the hands or feet Osteonecrosis of the jaw--pain, swelling, or redness in the mouth, numbness of the jaw, poor healing after dental work, unusual discharge from the mouth, visible  bones in the mouth Severe bone, joint, or muscle pain Skin infection--skin redness, swelling, warmth, or pain Side effects that usually do not require medical attention (report these to your care team if they continue or are bothersome): Back pain Headache Joint pain Muscle pain Pain in the hands, arms, legs, or feet Runny or stuffy nose Sore throat This list may not describe all possible side effects. Call your doctor for medical advice about side effects. You may report side effects to FDA at 1-800-FDA-1088. Where should I keep my medication? This medication is given in a hospital or clinic. It will not be stored at home. NOTE: This sheet is a summary. It may not cover all possible information. If you have questions about this medicine, talk to your doctor, pharmacist, or health care provider.  2024 Elsevier/Gold Standard (2022-08-15 00:00:00)  Denosumab Injection (Osteoporosis) What is this medication? DENOSUMAB (den oh SUE mab) prevents and treats osteoporosis. It works by  Interior and spatial designer stronger and less likely to break (fracture). It is a monoclonal antibody. This medicine may be used for other purposes; ask your health care provider or pharmacist if you have questions. COMMON BRAND NAME(S): Prolia What should I tell my care team before I take this medication? They need to know if you have any of these conditions: Dental or gum disease Had thyroid  or parathyroid (glands located in neck) surgery Having dental surgery or a tooth pulled Kidney disease Low levels of calcium  in the blood On dialysis Poor nutrition Thyroid  disease Trouble absorbing nutrients from your food An unusual or allergic reaction to denosumab, other medications, foods, dyes, or preservatives Pregnant or trying to get pregnant Breastfeeding How should I use this medication? This medication is injected under the skin. It is given by your care team in a hospital or clinic setting. A special MedGuide will be given to you before each treatment. Be sure to read this information carefully each time. Talk to your care team about the use of this medication in children. Special care may be needed. Overdosage: If you think you have taken too much of this medicine contact a poison control center or emergency room at once. NOTE: This medicine is only for you. Do not share this medicine with others. What if I miss a dose? Keep appointments for follow-up doses. It is important not to miss your dose. Call your care team if you are unable to keep an appointment. What may interact with this medication? Do not take this medication with any of the following: Other medications that contain denosumab This medication may also interact with the following: Medications that lower your chance of fighting infection Steroid medications, such as prednisone or cortisone This list may not describe all possible interactions. Give your health care provider a list of all the medicines, herbs, non-prescription  drugs, or dietary supplements you use. Also tell them if you smoke, drink alcohol, or use illegal drugs. Some items may interact with your medicine. What should I watch for while using this medication? Your condition will be monitored carefully while you are receiving this medication. You may need blood work done while taking this medication. This medication may increase your risk of getting an infection. Call your care team for advice if you get a fever, chills, sore throat, or other symptoms of a cold or flu. Do not treat yourself. Try to avoid being around people who are sick. Tell your dentist and dental surgeon that you are taking this medication. You should not have  major dental surgery while on this medication. See your dentist to have a dental exam and fix any dental problems before starting this medication. Take good care of your teeth while on this medication. Make sure you see your dentist for regular follow-up appointments. This medication may cause low levels of calcium  in your body. The risk of severe side effects is increased in people with kidney disease. Your care team may prescribe calcium  and vitamin D  to help prevent low calcium  levels while you take this medication. It is important to take calcium  and vitamin D  as directed by your care team. Talk to your care team if you may be pregnant. Serious birth defects may occur if you take this medication during pregnancy and for 5 months after the last dose. You will need a negative pregnancy test before starting this medication. Contraception is recommended while taking this medication and for 5 months after the last dose. Your care team can help you find the option that works for you. Talk to your care team before breastfeeding. Changes to your treatment plan may be needed. What side effects may I notice from receiving this medication? Side effects that you should report to your care team as soon as possible: Allergic reactions--skin rash,  itching, hives, swelling of the face, lips, tongue, or throat Infection--fever, chills, cough, sore throat, wounds that don't heal, pain or trouble when passing urine, general feeling of discomfort or being unwell Low calcium  level--muscle pain or cramps, confusion, tingling, or numbness in the hands or feet Osteonecrosis of the jaw--pain, swelling, or redness in the mouth, numbness of the jaw, poor healing after dental work, unusual discharge from the mouth, visible bones in the mouth Severe bone, joint, or muscle pain Skin infection--skin redness, swelling, warmth, or pain Side effects that usually do not require medical attention (report these to your care team if they continue or are bothersome): Back pain Headache Joint pain Muscle pain Pain in the hands, arms, legs, or feet Runny or stuffy nose Sore throat This list may not describe all possible side effects. Call your doctor for medical advice about side effects. You may report side effects to FDA at 1-800-FDA-1088. Where should I keep my medication? This medication is given in a hospital or clinic. It will not be stored at home. NOTE: This sheet is a summary. It may not cover all possible information. If you have questions about this medicine, talk to your doctor, pharmacist, or health care provider.  2024 Elsevier/Gold Standard (2022-08-15 00:00:00)

## 2024-05-14 DIAGNOSIS — H43813 Vitreous degeneration, bilateral: Secondary | ICD-10-CM | POA: Diagnosis not present

## 2024-05-14 DIAGNOSIS — H2511 Age-related nuclear cataract, right eye: Secondary | ICD-10-CM | POA: Diagnosis not present

## 2024-05-21 ENCOUNTER — Telehealth: Payer: Self-pay | Admitting: Family Medicine

## 2024-05-21 ENCOUNTER — Ambulatory Visit (INDEPENDENT_AMBULATORY_CARE_PROVIDER_SITE_OTHER)

## 2024-05-21 ENCOUNTER — Other Ambulatory Visit: Payer: Self-pay

## 2024-05-21 DIAGNOSIS — Z23 Encounter for immunization: Secondary | ICD-10-CM | POA: Diagnosis not present

## 2024-05-21 MED ORDER — ROSUVASTATIN CALCIUM 10 MG PO TABS: ORAL_TABLET | ORAL | 3 refills | Status: AC

## 2024-05-21 NOTE — Telephone Encounter (Signed)
 Patient states her cholesterol is really high so she needs a refill on her Crestor .  She is completely out.    Pharmacy--[facility]

## 2024-06-05 NOTE — Progress Notes (Signed)
 [facility] [address] [place] [address] Phone: [phone] Subjective:   Stacy Gillespie, am serving as a scribe for Dr. Arthea Claudene.  I'm seeing this patient by the request  of:  Johnny Garnette LABOR, MD  CC: Back pain  YEP:Dlagzrupcz  02/26/2024 Much better overall at this time.  Diet changes.  Follow-up with endocrinology and discussing the possibility of Evenity     Still has some.  Discussed with patient icing regimen and home exercises, discussed which activities to do in which ones to avoid.  Increase activity slowly.  A Tru pull lite given.  Increase activity.  Follow-up again.  3 months.     Updated 06/09/2024 Stacy Gillespie is a 73 y.o. female coming in with complaint of knee and back pain. Patient states that for 2 months she had less pain especially with stair climbing. Did a lot of walking a few weeks ago and now her pain has come back.   T spine pain increases with cleaning and standing in one spot while cooking. If she is not doing these activities she has little to no pain. Pain is in L shoulder and scapula. Lower back is no longer painful.   Will have to have cataract surgery as some point.  Needs a ride to surgery and inquires about medical transportation to this surgery.       Past Medical History:  Diagnosis Date   Allergy    Arthritis    Cancer (HCC) 05/2021   Breast   Cataract    Compression fracture of spine (HCC)    COPD (chronic obstructive pulmonary disease) (HCC)    Hyperlipidemia    Osteopenia 07/2018   T score -1.3 FRAX 9% / 1.1%   Osteoporosis    Personal history of radiation therapy    Thyroid  disease    Past Surgical History:  Procedure Laterality Date   BREAST BIOPSY Left 05/2021   BREAST LUMPECTOMY Left 08/16/2021   with lymph node dissection   BROKEN LEG     COLONOSCOPY  05/18/2022   per Dr. Aneita, adenomatous polyps, repeat in 7 yrs   NOSE SURGERY     Social History   Socioeconomic  History   Marital status: Married    Spouse name: Not on file   Number of children: Not on file   Years of education: Not on file   Highest education level: Not on file  Occupational History   Not on file  Tobacco Use   Smoking status: Former    Current packs/day: 0.00    Types: Cigarettes    Quit date: 07/07/2014    Years since quitting: 9.9   Smokeless tobacco: Never  Vaping Use   Vaping status: Never Used  Substance and Sexual Activity   Alcohol use: Yes    Alcohol/week: 0.0 standard drinks of alcohol    Comment: rare   Drug use: No   Sexual activity: Not Currently    Birth control/protection: Post-menopausal    Comment: 1st intercourse 73 yo-Fewer than 5 partners  Other Topics Concern   Not on file  Social History Narrative   Not on file   Social Drivers of Health   Financial Resource Strain: Not on file  Food Insecurity: No Food Insecurity (03/14/2022)   Hunger Vital Sign    Worried About Running Out of Food in the Last Year: Never true    Ran Out of Food in the Last Year: Never true  Transportation  Needs: No Transportation Needs (03/14/2022)   PRAPARE - Administrator, Civil Service (Medical): No    Lack of Transportation (Non-Medical): No  Physical Activity: Insufficiently Active (02/23/2023)   Exercise Vital Sign    Days of Exercise per Week: 1 day    Minutes of Exercise per Session: 60 min  Stress: No Stress Concern Present (02/23/2023)   [employer] of Occupational Health - Occupational Stress Questionnaire    Feeling of Stress : Not at all  Social Connections: Moderately Integrated (02/23/2023)   Social Connection and Isolation Panel    Frequency of Communication with Friends and Family: More than three times a week    Frequency of Social Gatherings with Friends and Family: Twice a week    Attends Religious Services: Never    Database Administrator or Organizations: Yes    Attends Engineer, Structural: More than 4 times per year     Marital Status: Married   Allergies  Allergen Reactions   Chlorhexidine Gluconate     Swelling of tongue ( mouth rinse )   Clindamycin/Lincomycin Diarrhea   Red Dye #40 (Allura Red) Diarrhea and Nausea And Vomiting   Family History  Problem Relation Age of Onset   Arthritis Mother    Hypertension Mother    Arthritis Brother    Heart disease Brother    Stomach cancer Maternal Grandmother 60   Colon cancer Maternal Grandfather 86   Heart disease Paternal Grandmother    Colon cancer Paternal Grandfather 80   Breast cancer Neg Hx    Colon polyps Neg Hx    Esophageal cancer Neg Hx    Rectal cancer Neg Hx     Current Outpatient Medications (Endocrine & Metabolic):    calcitonin, salmon, (MIACALCIN/FORTICAL) 200 UNIT/ACT nasal spray, Place 2 sprays into alternate nostrils daily.   levothyroxine  (SYNTHROID ) 100 MCG tablet, Take 1 tablet (100 mcg total) by mouth daily.  Current Outpatient Medications (Cardiovascular):    rosuvastatin  (CRESTOR ) 10 MG tablet, Take 1 tablet by mouth  daily  Current Outpatient Medications (Respiratory):    triamcinolone  (NASACORT  AQ) 55 MCG/ACT AERO nasal inhaler, Place 2 sprays into the nose daily.  Current Outpatient Medications (Analgesics):    acetaminophen (TYLENOL) 325 MG tablet, Take by mouth.   meloxicam  (MOBIC ) 15 MG tablet, Take 1 tablet (15 mg total) by mouth daily.   Current Outpatient Medications (Other):    Calcium  Carbonate-Vit D-Min (CALTRATE 600+D PLUS PO), Take by mouth.   Calcium  Citrate-Vitamin D  (CITRACAL + D PO), Take 1 tablet by mouth daily.   gabapentin  (NEURONTIN ) 100 MG capsule, TAKE 2 CAPSULES BY MOUTH TWICE  DAILY   tamoxifen (NOLVADEX) 10 MG tablet, Take 20 mg by mouth daily.   triamcinolone  cream (KENALOG ) 0.1 %, Apply 1 Application topically 2 (two) times daily.   VITAMIN D  PO, Take by mouth. Taking with calcium  and vitamin D  combination tab.   Vitamin D , Ergocalciferol , (DRISDOL ) 1.25 MG (50000 UNIT) CAPS capsule,  Take 1 capsule (50,000 Units total) by mouth every 7 (seven) days.   Reviewed prior external information including notes and imaging from  primary care provider As well as notes that were available from care everywhere and other healthcare systems.  Past medical history, social, surgical and family history all reviewed in electronic medical record.  No pertanent information unless stated regarding to the chief complaint.   Review of Systems:  No headache, visual changes, nausea, vomiting, diarrhea, constipation, dizziness, abdominal pain, skin rash, fevers,  chills, night sweats, weight loss, swollen lymph nodes, body aches, joint swelling, chest pain, shortness of breath, mood changes. POSITIVE muscle aches  Objective  Blood pressure 118/64, pulse 64, height 5' 6.5 (1.689 m), weight 146 lb (66.2 kg), SpO2 98%.   General: No apparent distress alert and oriented x3 mood and affect normal, dressed appropriately.  HEENT: Pupils equal, extraocular movements intact  Respiratory: Patient's speak in full sentences and does not appear short of breath  Cardiovascular: No lower extremity edema, non tender, no erythema  Right knee does have some crepitus noted.  Mild instability noted. Low back does have some loss lordosis.  No midline tenderness noted.    Impression and Recommendations:    The above documentation has been reviewed and is accurate and complete Zohair Epp M Malcolm Quast, DO

## 2024-06-09 ENCOUNTER — Ambulatory Visit (INDEPENDENT_AMBULATORY_CARE_PROVIDER_SITE_OTHER): Admitting: Family Medicine

## 2024-06-09 ENCOUNTER — Encounter: Payer: Self-pay | Admitting: Family Medicine

## 2024-06-09 VITALS — BP 118/64 | HR 64 | Ht 66.5 in | Wt 146.0 lb

## 2024-06-09 DIAGNOSIS — S22000A Wedge compression fracture of unspecified thoracic vertebra, initial encounter for closed fracture: Secondary | ICD-10-CM

## 2024-06-09 DIAGNOSIS — M859 Disorder of bone density and structure, unspecified: Secondary | ICD-10-CM

## 2024-06-09 DIAGNOSIS — M8000XS Age-related osteoporosis with current pathological fracture, unspecified site, sequela: Secondary | ICD-10-CM

## 2024-06-09 DIAGNOSIS — M222X1 Patellofemoral disorders, right knee: Secondary | ICD-10-CM

## 2024-06-09 DIAGNOSIS — M810 Age-related osteoporosis without current pathological fracture: Secondary | ICD-10-CM | POA: Insufficient documentation

## 2024-06-09 NOTE — Assessment & Plan Note (Signed)
 Continues to have pain.  Seems to be multifactorial.  Does have the arthritic changes.  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  Discussed icing regimen and home exercises.  Increase activity slowly.  Discussed icing regimen.  Follow-up again in 6 to 12 weeks

## 2024-06-09 NOTE — Patient Instructions (Addendum)
 Happy holidays! Stay active  Try new hinge brace on right knee  Referral to osteoporosis clinic would be great just for more info as well  [facility] [phone] For brace returns, please call [employer] at [phone]. You must call them to initiate the return and then you can return the brace to our office.   See me again in 2-3 months

## 2024-06-09 NOTE — Assessment & Plan Note (Signed)
 Has had the compression fracture previously.  Do not think we need to repeat imaging at this point with patient having very minimal discomfort in the back.  Do think the long-term treatment is necessary and referral to osteoporosis clinic could be beneficial.  Follow-up again in 6 to 12 weeks

## 2024-06-30 ENCOUNTER — Encounter: Payer: Self-pay | Admitting: Physician Assistant

## 2024-06-30 ENCOUNTER — Ambulatory Visit: Admitting: Physician Assistant

## 2024-06-30 VITALS — Ht 66.5 in | Wt 146.0 lb

## 2024-06-30 DIAGNOSIS — M810 Age-related osteoporosis without current pathological fracture: Secondary | ICD-10-CM

## 2024-06-30 NOTE — Progress Notes (Signed)
 Office Visit Note   Patient: Stacy Gillespie           Date of Birth: 1950/10/10           MRN: [id number] Visit Date: 06/30/2024              Requested by: Sharps Arthea CHRISTELLA, DO [address] [place],  [place] [address] PCP: Johnny Garnette LABOR, MD   Assessment & Plan: Visit Diagnoses:  1. Age-related osteoporosis without current pathological fracture     Plan: Patient is a pleasant 73 year old woman who is referred by Dr. Arthea Sharps for evaluation of osteoporosis.  She does have a history of a compression fracture at T7 along with osteopenia by bone density which categorizes her as having osteoporosis.  He has taken calcium  in the past currently takes Tums as she can tolerate this.  She does have a history of a compression fracture at T7 without any significant trauma.  She has no history of heart disease or stroke.  She does have a history of breast cancer.  She has no history of kidney disease gastric or peptic ulcer.  She has not had gastric bypass.  She does not have a history of reflux.  No history of seizures.  She did go through menopause when she was in 53 and did hormone replacement therapy for many years.  She takes 2000 mg she believes of calcium  a day and 2000 international units of vitamin D .  She is a former smoker for 45 years she smoked 1-1/2 packs of cigarettes.  She tried to exercise every day but because of her arthritis in her knee has had difficulty with this.  She has had no major dental work in the past year no family history of fragility fracture.  After based on her FRAX score she does have an increased risk of hip fracture at 4% and she does have a history of a thoracic spine fracture.  We discussed all the medications as well as side effects and treatment plans.  I think perhaps for her the most logical recommendation would be for Evenity or Prolia.  I have given her information about these medications.  She has got some cataract surgery coming up and she would like  to defer this till the new year.  In the meantime she is going to look back into getting into a class at the community center so she can do some more resistance training.  I do think her calcium  intake is adequate based on what she is told me.  I spent 45 minutes talking with her as well as reviewing her chart.  She may follow-up with me as needed  Follow-Up Instructions: Return if symptoms worsen or fail to improve.   Orders:  No orders of the defined types were placed in this encounter.  No orders of the defined types were placed in this encounter.     Procedures: No procedures performed   Clinical Data: No additional findings.   Subjective: Chief Complaint  Patient presents with   Osteoporosis    HPI patient is a pleasant 73 year old woman referred by Dr. Sharps for evaluation of osteoporosis at [facility].  Okay okay 12 okay and then [place]  straight okay okay hide your lower back okay 1:00 yeah okay thank you for tomorrow yeah I think yeah yeah Genna blender I got she okay so she was originally scheduled for 830 next Tuesday at 845 that is way too early after 930 for me  to schedule follow-up in 2 weeks to check for her to 45 to make sure wants to just general: She just could not open the door a lot yeah okay she is also seeing okay she has been to therapy yeah okay okay okay okay okay yes I think you are back in March I just think I think what I could sleep today I think [phone] I will be here to help you got a 2-hour delay yeah thank you bye-bye last patient seen she is hey sorry to bother you had a question we are starting tomorrow at 10 AM tomorrow so we will try to reschedule your patients to stay later than 3 tomorrow please yeah anybody just like tight [phone] are usually here okay okay to every 15 okay thank you appreciated yeah yeah Yeah thank you he said yes to every 15 minutes until three 73 year old to us  this year.  Patient had an think she is she states that maybe her  right knee so I have now a 30 so really it is just [phone] this day was a great time from Dr. Clent office just calling to report tried to call to reschedule Call me this morning call from Dr. Leonia office you are scheduled for appointment at 845 tomorrow morning due to the weather we are hoping late I was called to reschedule you give us  a callback [phone] prescription 6 pain this document pain this is Dr. Pricilla office appointment tomorrow at 9 AM to see Dr. Michiel we yes we are opening late due to the weather cells calling to reschedule you to the afternoon okay are you provide I was I cannot tomorrow but I can do it next Wednesday if you prefer okay so how about next Wednesday and another 15 without work okay okay a minute Katheryn calling about recent appointment you could reschedule for Appointment time hey I have yeah because were okay so what ever time he wants to come tomorrow he supposed to yeah thank you you have a scheduled actual thank you okay hey how are you good good I have an appointment scheduled for tomorrow at 9:30 PM lets work okay to look to do based on what.  Looks like she is CARE you have an appointment scheduled tomorrow at 62 and would like to reschedule me see what I can do you do next Tuesday 845 okay okay 945 next Tuesday okay 945 this on the 16th I will see that okay  Review of Systems  All other systems reviewed and are negative.    Objective: Vital Signs: Ht 5' 6.5 (1.689 m)   Wt 146 lb (66.2 kg)   BMI 23.21 kg/m   Physical Exam Constitutional:      Appearance: Normal appearance.  Pulmonary:     Effort: Pulmonary effort is normal.  Skin:    General: Skin is warm and dry.  Neurological:     General: No focal deficit present.     Mental Status: She is alert and oriented to person, place, and time.  Psychiatric:        Mood and Affect: Mood normal.        Behavior: Behavior normal.     Ortho Exam  Specialty Comments:  No specialty comments  available.  Imaging: No results found.   PMFS History: Patient Active Problem List   Diagnosis Date Noted   Age-related osteoporosis without current pathological fracture 06/09/2024   Thoracic compression fracture, closed, initial encounter (HCC) 07/23/2023   Patellofemoral  syndrome of right knee 05/24/2023   DDD (degenerative disc disease), lumbar 08/31/2022   Bursitis of both hips 07/26/2021   COPD (chronic obstructive pulmonary disease) with chronic bronchitis (HCC) 07/26/2021   Rosacea 07/26/2021   Osteoarthritis 07/26/2021   Malignant neoplasm of upper-outer quadrant of left breast in female, estrogen receptor positive (HCC) 07/04/2021   Ankle fracture, left 06/23/2021   Breast cancer, left breast (HCC) 06/23/2021   COPD (chronic obstructive pulmonary disease) (HCC) 01/22/2020   Vitamin D  deficiency 01/07/2016   Benign paroxysmal positional vertigo 09/05/2013   MICROSCOPIC HEMATURIA 11/28/2007   Arthropathy 11/28/2007   Hypothyroidism 08/28/2007   Hyperlipidemia 08/28/2007   Past Medical History:  Diagnosis Date   Allergy    Arthritis    Cancer (HCC) 05/2021   Breast   Cataract    Compression fracture of spine (HCC)    COPD (chronic obstructive pulmonary disease) (HCC)    Hyperlipidemia    Osteopenia 07/2018   T score -1.3 FRAX 9% / 1.1%   Osteoporosis    Personal history of radiation therapy    Thyroid  disease     Family History  Problem Relation Age of Onset   Arthritis Mother    Hypertension Mother    Arthritis Brother    Heart disease Brother    Stomach cancer Maternal Grandmother 48   Colon cancer Maternal Grandfather 60   Heart disease Paternal Grandmother    Colon cancer Paternal Grandfather 64   Breast cancer Neg Hx    Colon polyps Neg Hx    Esophageal cancer Neg Hx    Rectal cancer Neg Hx     Past Surgical History:  Procedure Laterality Date   BREAST BIOPSY Left 05/2021   BREAST LUMPECTOMY Left 08/16/2021   with lymph node dissection    BROKEN LEG     COLONOSCOPY  05/18/2022   per Dr. Aneita, adenomatous polyps, repeat in 7 yrs   NOSE SURGERY     Social History   Occupational History   Not on file  Tobacco Use   Smoking status: Former    Current packs/day: 0.00    Types: Cigarettes    Quit date: 07/07/2014    Years since quitting: 9.9   Smokeless tobacco: Never  Vaping Use   Vaping status: Never Used  Substance and Sexual Activity   Alcohol use: Yes    Alcohol/week: 0.0 standard drinks of alcohol    Comment: rare   Drug use: No   Sexual activity: Not Currently    Birth control/protection: Post-menopausal    Comment: 1st intercourse 73 yo-Fewer than 5 partners

## 2024-09-10 ENCOUNTER — Ambulatory Visit: Admitting: Family Medicine
